# Patient Record
Sex: Female | Born: 1970 | Race: Black or African American | Hispanic: No | Marital: Married | Smoking: Never smoker
Health system: Southern US, Community
[De-identification: ages and names within clinical notes are randomized; demographics above are authoritative.]

## PROBLEM LIST (undated history)

## (undated) DIAGNOSIS — D649 Anemia, unspecified: Secondary | ICD-10-CM

## (undated) DIAGNOSIS — I10 Essential (primary) hypertension: Secondary | ICD-10-CM

## (undated) DIAGNOSIS — D563 Thalassemia minor: Secondary | ICD-10-CM

## (undated) HISTORY — DX: Thalassemia minor: D56.3

## (undated) HISTORY — DX: Anemia, unspecified: D64.9

## (undated) HISTORY — PX: OTHER SURGICAL HISTORY: SHX169

## (undated) HISTORY — DX: Essential (primary) hypertension: I10

---

## 1999-07-24 ENCOUNTER — Other Ambulatory Visit: Admission: RE | Admit: 1999-07-24 | Discharge: 1999-07-24 | Payer: Self-pay | Admitting: Internal Medicine

## 2001-04-25 ENCOUNTER — Other Ambulatory Visit: Admission: RE | Admit: 2001-04-25 | Discharge: 2001-04-25 | Payer: Self-pay | Admitting: Internal Medicine

## 2002-11-20 ENCOUNTER — Other Ambulatory Visit: Admission: RE | Admit: 2002-11-20 | Discharge: 2002-11-20 | Payer: Self-pay | Admitting: Surgery

## 2004-01-29 ENCOUNTER — Other Ambulatory Visit: Admission: RE | Admit: 2004-01-29 | Discharge: 2004-01-29 | Payer: Self-pay | Admitting: Internal Medicine

## 2005-11-24 ENCOUNTER — Inpatient Hospital Stay (HOSPITAL_COMMUNITY): Admission: AD | Admit: 2005-11-24 | Discharge: 2005-11-24 | Payer: Self-pay | Admitting: *Deleted

## 2005-11-24 ENCOUNTER — Inpatient Hospital Stay (HOSPITAL_COMMUNITY): Admission: AD | Admit: 2005-11-24 | Discharge: 2005-11-28 | Payer: Self-pay | Admitting: Obstetrics and Gynecology

## 2005-11-29 ENCOUNTER — Encounter: Admission: RE | Admit: 2005-11-29 | Discharge: 2005-12-29 | Payer: Self-pay | Admitting: Obstetrics and Gynecology

## 2005-12-06 ENCOUNTER — Inpatient Hospital Stay (HOSPITAL_COMMUNITY): Admission: AD | Admit: 2005-12-06 | Discharge: 2005-12-06 | Payer: Self-pay | Admitting: Obstetrics and Gynecology

## 2005-12-07 ENCOUNTER — Ambulatory Visit: Admission: RE | Admit: 2005-12-07 | Discharge: 2005-12-07 | Payer: Self-pay | Admitting: Obstetrics and Gynecology

## 2005-12-08 ENCOUNTER — Inpatient Hospital Stay (HOSPITAL_COMMUNITY): Admission: AD | Admit: 2005-12-08 | Discharge: 2005-12-08 | Payer: Self-pay | Admitting: Obstetrics and Gynecology

## 2005-12-30 ENCOUNTER — Encounter: Admission: RE | Admit: 2005-12-30 | Discharge: 2006-01-28 | Payer: Self-pay | Admitting: Obstetrics and Gynecology

## 2006-01-29 ENCOUNTER — Encounter: Admission: RE | Admit: 2006-01-29 | Discharge: 2006-02-28 | Payer: Self-pay | Admitting: Obstetrics and Gynecology

## 2006-03-01 ENCOUNTER — Encounter: Admission: RE | Admit: 2006-03-01 | Discharge: 2006-03-30 | Payer: Self-pay | Admitting: Obstetrics and Gynecology

## 2006-03-31 ENCOUNTER — Encounter: Admission: RE | Admit: 2006-03-31 | Discharge: 2006-04-30 | Payer: Self-pay | Admitting: Obstetrics and Gynecology

## 2006-05-01 ENCOUNTER — Encounter: Admission: RE | Admit: 2006-05-01 | Discharge: 2006-05-31 | Payer: Self-pay | Admitting: Obstetrics and Gynecology

## 2009-06-13 ENCOUNTER — Encounter (INDEPENDENT_AMBULATORY_CARE_PROVIDER_SITE_OTHER): Payer: Self-pay | Admitting: Obstetrics & Gynecology

## 2009-06-13 ENCOUNTER — Ambulatory Visit (HOSPITAL_COMMUNITY): Admission: AD | Admit: 2009-06-13 | Discharge: 2009-06-13 | Payer: Self-pay | Admitting: Internal Medicine

## 2009-06-28 ENCOUNTER — Inpatient Hospital Stay (HOSPITAL_COMMUNITY): Admission: AD | Admit: 2009-06-28 | Discharge: 2009-06-28 | Payer: Self-pay | Admitting: Obstetrics & Gynecology

## 2010-12-10 LAB — CBC
HCT: 32.3 % — ABNORMAL LOW (ref 36.0–46.0)
Hemoglobin: 10.5 g/dL — ABNORMAL LOW (ref 12.0–15.0)
Hemoglobin: 10.1 g/dL — ABNORMAL LOW (ref 12.0–15.0)
MCHC: 32 g/dL (ref 30.0–36.0)
MCV: 77.7 fL — ABNORMAL LOW (ref 78.0–100.0)
MCV: 77.8 fL — ABNORMAL LOW (ref 78.0–100.0)
RDW: 15 % (ref 11.5–15.5)
RDW: 15.3 % (ref 11.5–15.5)
WBC: 9.7 10*3/uL (ref 4.0–10.5)

## 2010-12-10 LAB — CREATININE, SERUM: GFR calc Af Amer: >60 mL/min (ref >60)

## 2010-12-10 LAB — DIFFERENTIAL
Basophils Absolute: 0 10*3/uL (ref 0.0–0.1)
Eosinophils Absolute: 0 10*3/uL (ref 0.0–0.7)
Lymphs Abs: 1.2 10*3/uL (ref 0.7–4.0)
Neutrophils Relative %: 83 % — ABNORMAL HIGH (ref 43–77)

## 2010-12-10 LAB — TYPE AND SCREEN
Antibody Screen: POSITIVE
DAT, IgG: NEGATIVE

## 2010-12-10 LAB — AST: AST: 18 U/L (ref 0–37)

## 2010-12-10 LAB — HCG, QUANTITATIVE, PREGNANCY: hCG, Beta Chain, Quant, S: 730 m[IU]/mL — ABNORMAL HIGH (ref <5)

## 2011-01-22 NOTE — Op Note (Signed)
[name], Alyssa Dunn               ACCOUNT NO.:  [id number]   MEDICAL RECORD NO.:  [id number]          PATIENT TYPE:  INP   LOCATION:  9105                          FACILITY:  [facility]   PHYSICIAN:  Lenoard Aden, M.D.DATE OF BIRTH:  June 14, 1971   DATE OF PROCEDURE:  DATE OF DISCHARGE:  11/28/2005                                 OPERATIVE REPORT   CHIEF COMPLAINT:  Thirty-four week intrauterine pregnancy, breech  presentation in active labor with spontaneous rupture of membranes and  complete dilatation.   POSTOPERATIVE DIAGNOSES:  1.  Thirty-four week intrauterine pregnancy, breech presentation in active      labor with spontaneous rupture of membranes and complete dilatation.  2.  Septate uterus.  Pregnancy in the right section of uterus.   OPERATION/PROCEDURE:  Primary emergent low segment transverse cesarean  section.   SURGEON:  Lenoard Aden, M.D.   ANESTHESIA:  Spinal.   ESTIMATED BLOOD LOSS:  1000 mL.   COMPLICATIONS:  None.   DRAINS:  Foley catheter.   COUNTS:  Correct.   CONDITION:  The patient to recovery in good condition.   DESCRIPTION OF PROCEDURE:  After being apprised of the risks of anesthesia,  infection, bleeding, injury to abdominal organs and need for repair, delayed  versus immediate complications to include bowel and bladder injury, the  patient is brought to the operating room where she is administered a spinal  anesthetic without complications, prepped and draped in the usual sterile  fashion.  Foley catheter placed.  After achieving adequate anesthesia, a  Pfannenstiel skin incision made, carried down to the fascia which was nicked  in the midline and opened transversely using Mayo scissors.  Rectus muscles  were dissected sharply in the midline.  Peritoneum entered sharply and  bladder blade placed.  Visceral peritoneum was scored in a smile-like  fashion.  Kerr hysterotomy incision made.  Atraumatic delivery of a footling  breech  presentation, is handed to the pediatricians in attendance.  Apgars 8  and 9, cord blood collected.  Placenta delivered manually intact.  Three-  vessel cord noted and sent to labor and delivery.  Uterus is exteriorized.  There is a soft, small subserosal fibroid in the midline of the fundus.  Normal tubes and normal ovaries.  Upon palpating the interior of the uterus,  there is an enlarged right cavity consistent with where a    pregnancy was  present and a thick midline septum with a separate left cavity noted.  Uterus was curetted using a dry lap pack and uterus is closed in two layers  using a 0 Monocryl suture in continuous running fashion.  Second imbricating  layer placed.  Bladder flap inspected and found to be hemostatic.  Irrigation is accomplished. There is a midline bleeder which is secured  using interrupted suture and a small area of bleeding along the bladder flap  which  is secured using an interrupted 3-0 Vicryl suture on an SH needle.  Irrigation is further accomplished.  Perineum is inspected and found to be  hemostatic.  Fascia then closed using 0 Monocryl in continuous running  fashion.  Skin closed using staples. The patient tolerates is transferred to  the recovery room in good condition.      Lenoard Aden, M.D.  Electronically Signed     RJT/MEDQ  D:  11/24/2005  T:  11/25/2005  Job:  [id number]

## 2011-01-22 NOTE — Discharge Summary (Signed)
NAMEKERBY, HOCKLEY               ACCOUNT NO.:  [id number]   MEDICAL RECORD NO.:  [id number]           PATIENT TYPE:   LOCATION:                                 FACILITY:   PHYSICIAN:  Lenoard Aden, M.D.     DATE OF BIRTH:   DATE OF ADMISSION:  11/24/2005  DATE OF DISCHARGE:  11/27/2005                                 DISCHARGE SUMMARY   Patient underwent uncomplicated emergent cesarean section for breech in  active labor, fully dilated.  Postoperative course uncomplicated.  Tolerated  diet well.  Discharged to home day three.  Discharge teaching done.  Follow  up in the office in four to six weeks.      Lenoard Aden, M.D.  Electronically Signed     RJT/MEDQ  D:  12/29/2005  T:  12/30/2005  Job:  [id number]

## 2011-03-22 ENCOUNTER — Ambulatory Visit
Admission: RE | Admit: 2011-03-22 | Discharge: 2011-03-22 | Disposition: A | Discharge status: Home / Self Care | Payer: BC Managed Care – PPO | Source: Ambulatory Visit | Attending: Family Medicine | Admitting: Family Medicine

## 2011-03-22 ENCOUNTER — Other Ambulatory Visit: Payer: Self-pay | Admitting: Family Medicine

## 2011-03-22 DIAGNOSIS — M545 Low back pain: Secondary | ICD-10-CM

## 2012-06-16 ENCOUNTER — Other Ambulatory Visit: Payer: Self-pay | Admitting: Family Medicine

## 2012-06-16 ENCOUNTER — Other Ambulatory Visit (HOSPITAL_COMMUNITY)
Admission: RE | Admit: 2012-06-16 | Discharge: 2012-06-16 | Disposition: A | Discharge status: Home / Self Care | Payer: BC Managed Care – PPO | Source: Ambulatory Visit | Attending: Family Medicine | Admitting: Family Medicine

## 2012-06-16 DIAGNOSIS — Z124 Encounter for screening for malignant neoplasm of cervix: Secondary | ICD-10-CM | POA: Insufficient documentation

## 2012-10-03 ENCOUNTER — Other Ambulatory Visit: Payer: Self-pay | Admitting: Family Medicine

## 2012-10-03 DIAGNOSIS — R102 Pelvic and perineal pain: Secondary | ICD-10-CM

## 2012-10-03 DIAGNOSIS — R1032 Left lower quadrant pain: Secondary | ICD-10-CM

## 2012-10-06 ENCOUNTER — Ambulatory Visit
Admission: RE | Admit: 2012-10-06 | Discharge: 2012-10-06 | Disposition: A | Discharge status: Home / Self Care | Payer: BC Managed Care – PPO | Source: Ambulatory Visit | Attending: Family Medicine | Admitting: Family Medicine

## 2012-10-06 DIAGNOSIS — R1032 Left lower quadrant pain: Secondary | ICD-10-CM

## 2012-10-06 DIAGNOSIS — R102 Pelvic and perineal pain: Secondary | ICD-10-CM

## 2013-11-05 ENCOUNTER — Other Ambulatory Visit: Payer: Self-pay | Admitting: Family Medicine

## 2013-11-05 DIAGNOSIS — Z1231 Encounter for screening mammogram for malignant neoplasm of breast: Secondary | ICD-10-CM

## 2013-11-20 ENCOUNTER — Ambulatory Visit
Admission: RE | Admit: 2013-11-20 | Discharge: 2013-11-20 | Disposition: A | Discharge status: Home / Self Care | Payer: BC Managed Care – PPO | Source: Ambulatory Visit | Attending: Family Medicine | Admitting: Family Medicine

## 2013-11-20 DIAGNOSIS — Z1231 Encounter for screening mammogram for malignant neoplasm of breast: Secondary | ICD-10-CM

## 2013-11-22 ENCOUNTER — Other Ambulatory Visit: Payer: Self-pay | Admitting: Family Medicine

## 2013-11-22 DIAGNOSIS — R928 Other abnormal and inconclusive findings on diagnostic imaging of breast: Secondary | ICD-10-CM

## 2013-12-05 ENCOUNTER — Ambulatory Visit
Admission: RE | Admit: 2013-12-05 | Discharge: 2013-12-05 | Disposition: A | Discharge status: Home / Self Care | Payer: BC Managed Care – PPO | Source: Ambulatory Visit | Attending: Family Medicine | Admitting: Family Medicine

## 2013-12-05 DIAGNOSIS — R928 Other abnormal and inconclusive findings on diagnostic imaging of breast: Secondary | ICD-10-CM

## 2015-10-22 IMAGING — MG MM DIGITAL DIAGNOSTIC UNILAT*L*
5 series · 5 of 5 positions shown · non-contrast
Comparison: Baseline screening mammogram 11/20/2013

CLINICAL DATA: Further evaluation of possible asymmetry posterior
medial left breast and calcifications upper-outer quadrant left
breast

EXAM:
DIGITAL DIAGNOSTIC  LEFT MAMMOGRAM

[L ML (1 of 2)]
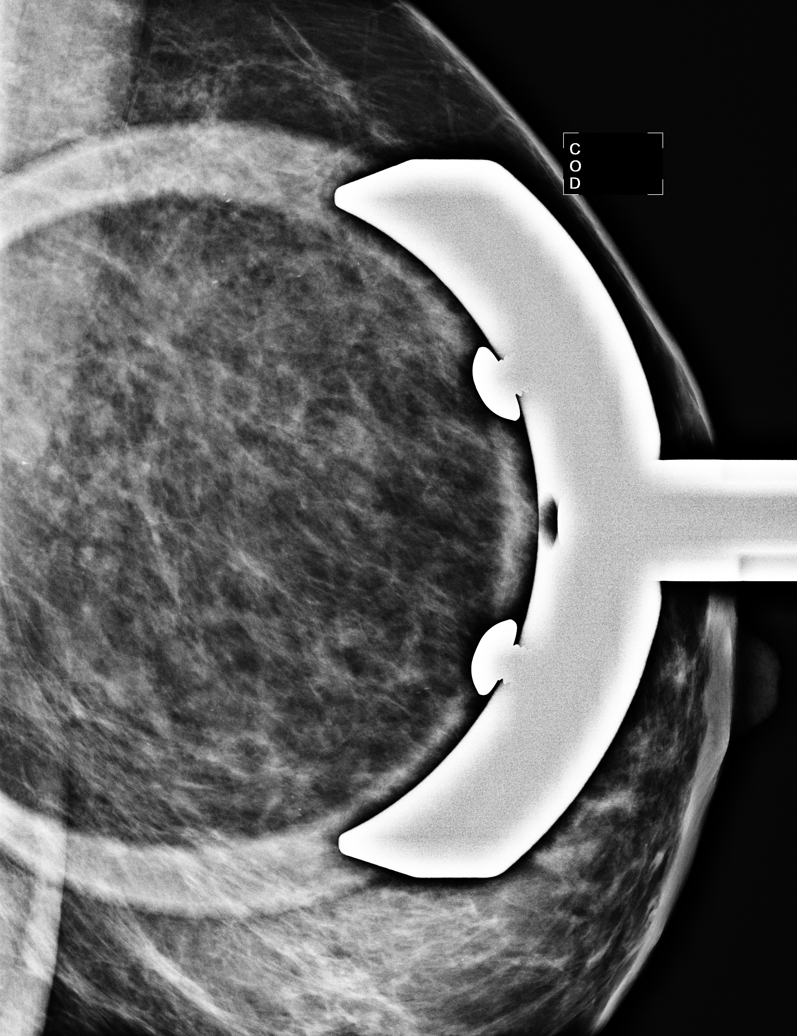

[L ML (2 of 2)]
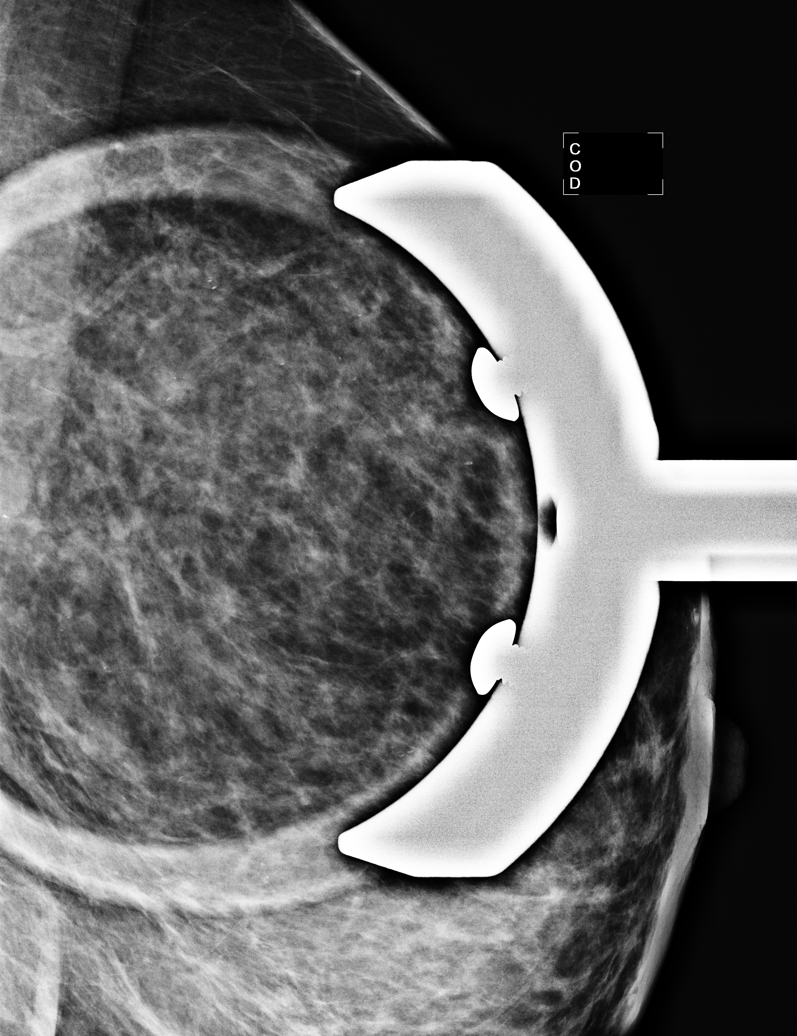

[L MLO]
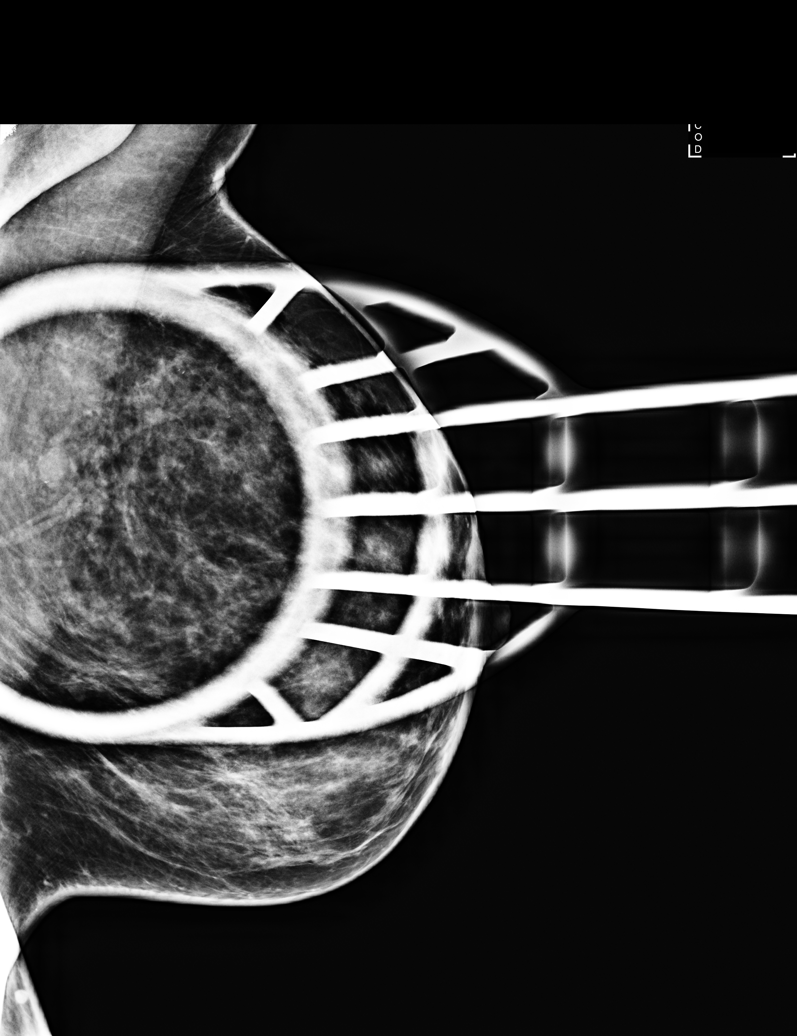

[L CC (1 of 2)]
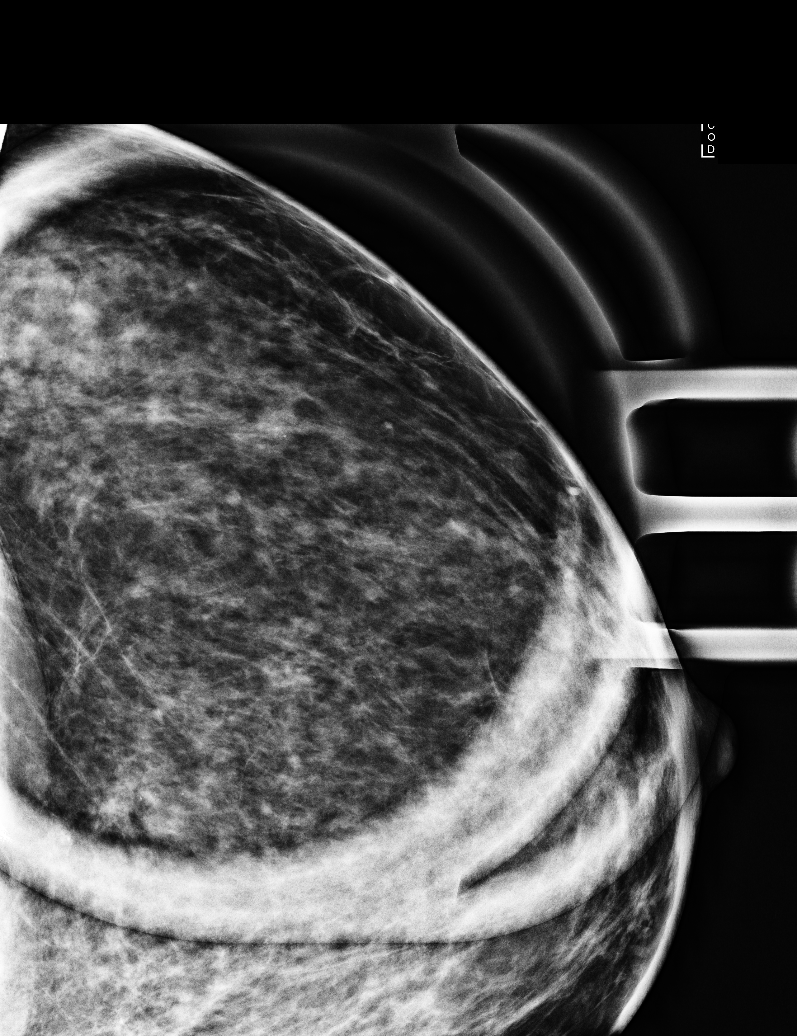

[L CC (2 of 2)]
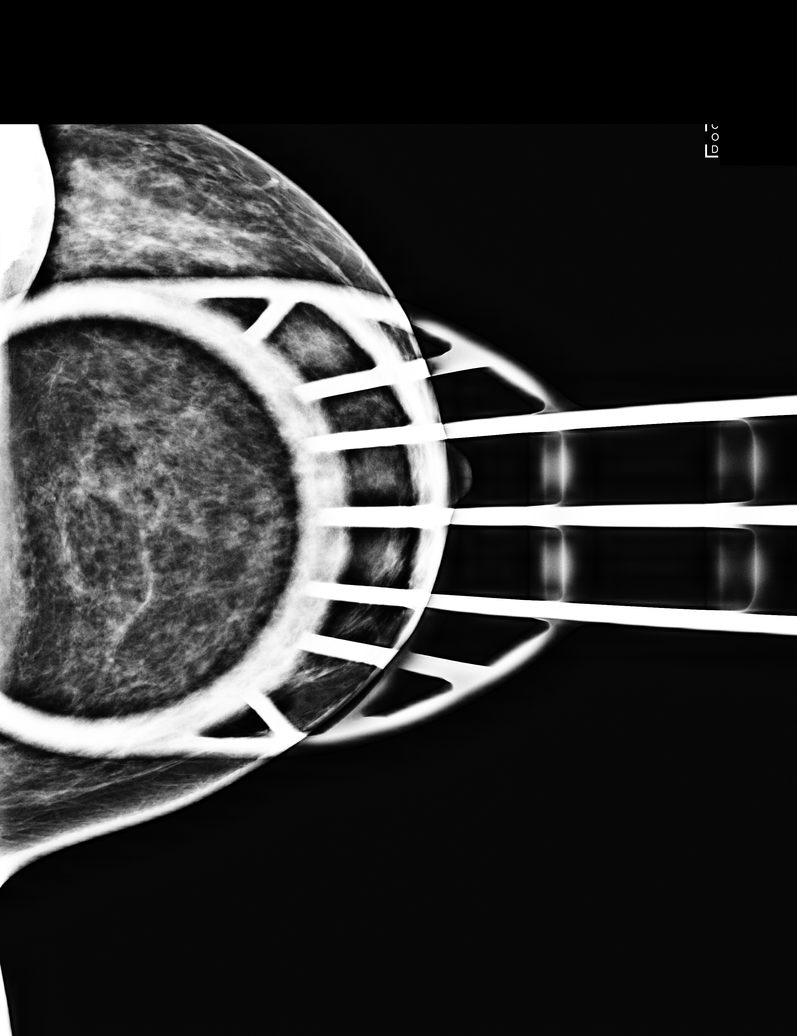

[5 of 5 positions shown; findings below may reference images not displayed]

ACR Breast Density Category c: The breast tissue is heterogeneously
dense, which may obscure small masses.
FINDINGS: Calcifications are characteristic milk of calcium, benign and
regionally distributed throughout the left upper outer quadrant on
magnified views. There is no persistent asymmetry on spot
compression views.
IMPRESSION: No suspicious findings.  Benign calcifications.

RECOMMENDATION:
Screening bilateral mammogram in 1 year

I have discussed the findings and recommendations with the patient.
Results were also provided in writing at the conclusion of the
visit. If applicable, a reminder letter will be sent to the patient
regarding the next appointment.

BI-RADS CATEGORY  2: Benign Finding(s)

## 2019-10-11 ENCOUNTER — Encounter: Payer: Self-pay | Admitting: Family Medicine

## 2019-10-11 ENCOUNTER — Other Ambulatory Visit: Payer: Self-pay

## 2019-10-11 ENCOUNTER — Ambulatory Visit: Payer: Managed Care, Other (non HMO) | Attending: Family Medicine | Admitting: Family Medicine

## 2019-10-11 VITALS — BP 164/100 | HR 102 | Temp 98.4°F | Resp 16 | Ht 66.5 in | Wt 189.0 lb

## 2019-10-11 DIAGNOSIS — H538 Other visual disturbances: Secondary | ICD-10-CM

## 2019-10-11 DIAGNOSIS — R5383 Other fatigue: Secondary | ICD-10-CM | POA: Diagnosis not present

## 2019-10-11 DIAGNOSIS — D649 Anemia, unspecified: Secondary | ICD-10-CM

## 2019-10-11 DIAGNOSIS — R030 Elevated blood-pressure reading, without diagnosis of hypertension: Secondary | ICD-10-CM | POA: Diagnosis not present

## 2019-10-11 DIAGNOSIS — Z13220 Encounter for screening for lipoid disorders: Secondary | ICD-10-CM

## 2019-10-11 LAB — GLUCOSE, POCT (MANUAL RESULT ENTRY): POC Glucose: 68 mg/dL — AB (ref 70–99)

## 2019-10-11 LAB — POCT GLYCOSYLATED HEMOGLOBIN (HGB A1C): Hemoglobin A1C: 5.4 % (ref 4.0–5.6)

## 2019-10-11 NOTE — Progress Notes (Signed)
Subjective:  Patient ID: Alyssa Dunn, female    DOB: Jan 08, 1971  Age: 49 y.o. MRN: [id number]  CC: New Patient (Initial Visit)   HPI Alyssa Dunn, 49 year old African-American female, new to the practice, who presents to establish care due to concern of recurrent issues with blurred vision.  She reports that over the past year she has noticed recurrent episodes of blurred vision and sometimes feels as if there is almost a film over her vision.  She denies sensation of darkening of vision as if a blind is being pulled down in front of her eyes.  She has occasional mild dizziness with changes in position but no issues with recurrent headaches.  No episodes of focal numbness or weakness.  She does feel as if she has had a mild increase in fatigue.  She notes that because of the current pandemic she is not as active.  She is currently Biomedical engineer.  Patient lived in the [place] area but moved back to [place] in 2014 and reports that when living in [place] she is much more active as she walks most places that she needed to go but since returning recently to [place], her lifestyle has been more sedentary.  She feels that she has gained weight.        She denies any issues with increased thirst or urinary frequency.  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  She denies any issues with chest pain or palpitations, no shortness of breath or cough.  No abdominal pain-no nausea/vomiting/diarrhea or constipation.  She has had no blood in the stool or black stools.  She has not had any issues with unexplained muscle or joint pain and no peripheral edema.  She has had no prior diagnoses of gestational diabetes, no prior hypertension or preeclampsia.  She has noticed some areas of increased pigmentation on her lower legs.  She is not currently on any prescription medications.  She occasionally takes an over-the-counter iron  supplement.  Past Medical History:  Diagnosis Date  . Anemia   . Thalassemia trait     Past Surgical History:  Procedure Laterality Date  . CESAREAN SECTION  2014  . laparoscopy      Family History  Problem Relation Age of Onset  . Renal Disease Paternal Grandmother   . Stroke Paternal Grandfather   . Diabetes Neg Hx   . Heart disease Neg Hx   . Cancer Neg Hx     Social History   Tobacco Use  . Smoking status: Never Smoker  . Smokeless tobacco: Never Used  Substance Use Topics  . Alcohol use: Yes    Alcohol/week: 2.0 standard drinks    Types: 2 Glasses of wine per week    ROS Review of Systems  Constitutional: Positive for fatigue. Negative for chills and fever.       Weight gain which she believes is due to age/decreased level of activity with pandemic  HENT: Negative for sore throat and trouble swallowing.   Eyes: Positive for visual disturbance. Negative for photophobia.  Respiratory: Negative for cough and shortness of breath.   Cardiovascular: Negative for chest pain and palpitations.  Gastrointestinal: Negative for abdominal pain, blood in stool, constipation, diarrhea and nausea.  Endocrine: Negative for polydipsia, polyphagia and polyuria.  Genitourinary: Negative for dysuria and frequency.  Musculoskeletal: Negative for arthralgias and back pain.  Neurological: Positive for headaches. Negative for dizziness.  Hematological: Negative for adenopathy. Does not bruise/bleed easily.    Objective:   Today's Vitals: BP (!) 164/100 (BP Location: Left Arm, Cuff Size: Large)   Pulse (!) 102   Temp 98.4 F (36.9 C)   Resp 16   Ht 5' 6.5" (1.689 m)   Wt 189 lb (85.7 kg)   LMP 09/14/2019 (Approximate)   SpO2 100%   BMI 30.05 kg/m   Physical Exam Constitutional:      General: She is not in acute distress.    Appearance: Normal appearance. She is not ill-appearing.  Neck:     Vascular: No carotid bruit.  Cardiovascular:     Rate and Rhythm: Normal rate  and regular rhythm.  Pulmonary:     Effort: Pulmonary effort is normal.     Breath sounds: Normal breath sounds.  Abdominal:     Palpations: Abdomen is soft.     Tenderness: There is no abdominal tenderness. There is no right CVA tenderness, left CVA tenderness, guarding or rebound.  Musculoskeletal:        General: No tenderness or deformity.     Cervical back: Normal range of motion and neck supple. No tenderness.     Right lower leg: No edema.     Left lower leg: No edema.  Lymphadenopathy:     Cervical: No cervical adenopathy.  Skin:    General: Skin is warm and dry.  Neurological:     General: No focal deficit present.     Mental Status: She is alert and oriented to person, place, and time.     Comments: No nystagmus but mild lag in eye movements with side to side gaze  Psychiatric:        Mood and Affect: Mood normal.        Behavior: Behavior normal.     Assessment & Plan:  1. Blurred vision Patient with complaint of blurred vision.  Glucose and hemoglobin A1c done at today's visit to rule out hypoglycemia/diabetes as a cause of her blurred vision.  She will additionally have BMP to look for elevated glucose or electrolyte abnormality.  On examination, she does have some mild lack and eye movements which may or may not be a contributing factor in her blurred vision.  She will be referred to ophthalmology for further evaluation and treatment.  Patient had blood sugar 68 without any hypoglycemic symptoms and hemoglobin A1c was normal at 5.4. - Glucose (CBG) - HgB A1c - Basic Metabolic Panel - Ambulatory referral to Ophthalmology  2. Anemia, unspecified type Patient with history of anemia as well as alpha thalassemia trait.  She will have repeat CBC at today's visit and will be notified if further intervention such as iron supplements are needed to help with anemia.  Continue diet of foods rich in iron. - CBC  3. Elevated blood-pressure reading without diagnosis of  hypertension Blood pressure on repeat with manual reading of 164/100.  She would like to try dietary changes and weight loss prior to starting any medication to help lower her blood pressure.  Educational material on [employer] as well as on preventing hypertension were given as part of AVS.  Discussed diagnosis and treatment with the patient at today's visit.  4. Fatigue, unspecified type Patient has had some mild fatigue which she attributes to her history of anemia as well as weight gain as she has been less active during the current Covid pandemic.  She will have blood work and follow-up of fatigue including CBC  to assess anemia, basic metabolic panel to look for electrolyte abnormality or elevated blood sugar as well as TSH to look for thyroid disorder such as an underactive thyroid which may be contributing to her fatigue. - CBC - Basic Metabolic Panel - TSH   An After Visit Summary was printed and given to the patient.   Follow-up: Return in about 3 weeks (around 11/01/2019) for Blood pressure/lab f/u.    Antony Blackbird MD

## 2019-10-11 NOTE — Patient Instructions (Addendum)
Hypertension, Adult Hypertension is another name for high blood pressure. High blood pressure forces your heart to work harder to pump blood. This can cause problems over time. There are two numbers in a blood pressure reading. There is a top number (systolic) over a bottom number (diastolic). It is best to have a blood pressure that is below 120/80. Healthy choices can help lower your blood pressure, or you may need medicine to help lower it. What are the causes? The cause of this condition is not known. Some conditions may be related to high blood pressure. What increases the risk?  Smoking.  Having type 2 diabetes mellitus, high cholesterol, or both.  Not getting enough exercise or physical activity.  Being overweight.  Having too much fat, sugar, calories, or salt (sodium) in your diet.  Drinking too much alcohol.  Having long-term (chronic) kidney disease.  Having a family history of high blood pressure.  Age. Risk increases with age.  Race. You may be at higher risk if you are African American.  Gender. Men are at higher risk than women before age 45. After age 65, women are at higher risk than men.  Having obstructive sleep apnea.  Stress. What are the signs or symptoms?  High blood pressure may not cause symptoms. Very high blood pressure (hypertensive crisis) may cause: ? Headache. ? Feelings of worry or nervousness (anxiety). ? Shortness of breath. ? Nosebleed. ? A feeling of being sick to your stomach (nausea). ? Throwing up (vomiting). ? Changes in how you see. ? Very bad chest pain. ? Seizures. How is this treated?  This condition is treated by making healthy lifestyle changes, such as: ? Eating healthy foods. ? Exercising more. ? Drinking less alcohol.  Your health care provider may prescribe medicine if lifestyle changes are not enough to get your blood pressure under control, and if: ? Your top number is above 130. ? Your bottom number is above  80.  Your personal target blood pressure may vary. Follow these instructions at home: Eating and drinking   If told, follow the DASH eating plan. To follow this plan: ? Fill one half of your plate at each meal with fruits and vegetables. ? Fill one fourth of your plate at each meal with whole grains. Whole grains include whole-wheat pasta, brown rice, and whole-grain bread. ? Eat or drink low-fat dairy products, such as skim milk or low-fat yogurt. ? Fill one fourth of your plate at each meal with low-fat (lean) proteins. Low-fat proteins include fish, chicken without skin, eggs, beans, and tofu. ? Avoid fatty meat, cured and processed meat, or chicken with skin. ? Avoid pre-made or processed food.  Eat less than 1,500 mg of salt each day.  Do not drink alcohol if: ? Your doctor tells you not to drink. ? You are pregnant, may be pregnant, or are planning to become pregnant.  If you drink alcohol: ? Limit how much you use to:  0-1 drink a day for women.  0-2 drinks a day for men. ? Be aware of how much alcohol is in your drink. In the [place] one drink equals one 12 oz bottle of beer (355 mL), one 5 oz glass of wine (148 mL), or one 1 oz glass of hard liquor (44 mL). Lifestyle   Work with your doctor to stay at a healthy weight or to lose weight. Ask your doctor what the best weight is for you.  Get at least 30 minutes of exercise most   days of the week. This may include walking, swimming, or biking.  Get at least 30 minutes of exercise that strengthens your muscles (resistance exercise) at least 3 days a week. This may include lifting weights or doing Pilates.  Do not use any products that contain nicotine or tobacco, such as cigarettes, e-cigarettes, and chewing tobacco. If you need help quitting, ask your doctor.  Check your blood pressure at home as told by your doctor.  Keep all follow-up visits as told by your doctor. This is important. Medicines  Take over-the-counter  and prescription medicines only as told by your doctor. Follow directions carefully.  Do not skip doses of blood pressure medicine. The medicine does not work as well if you skip doses. Skipping doses also puts you at risk for problems.  Ask your doctor about side effects or reactions to medicines that you should watch for. Contact a doctor if you:  Think you are having a reaction to the medicine you are taking.  Have headaches that keep coming back (recurring).  Feel dizzy.  Have swelling in your ankles.  Have trouble with your vision. Get help right away if you:  Get a very bad headache.  Start to feel mixed up (confused).  Feel weak or numb.  Feel faint.  Have very bad pain in your: ? Chest. ? Belly (abdomen).  Throw up more than once.  Have trouble breathing. Summary  Hypertension is another name for high blood pressure.  High blood pressure forces your heart to work harder to pump blood.  For most people, a normal blood pressure is less than 120/80.  Making healthy choices can help lower blood pressure. If your blood pressure does not get lower with healthy choices, you may need to take medicine. This information is not intended to replace advice given to you by your health care provider. Make sure you discuss any questions you have with your health care provider. Document Revised: 05/03/2018 Document Reviewed: 05/03/2018 Elsevier Patient Education  2020 [employer] DASH Eating Plan DASH stands for "Dietary Approaches to Stop Hypertension." The DASH eating plan is a healthy eating plan that has been shown to reduce high blood pressure (hypertension). It may also reduce your risk for type 2 diabetes, heart disease, and stroke. The DASH eating plan may also help with weight loss. What are tips for following this plan?  General guidelines  Avoid eating more than 2,300 mg (milligrams) of salt (sodium) a day. If you have hypertension, you may need to reduce your  sodium intake to 1,500 mg a day.  Limit alcohol intake to no more than 1 drink a day for nonpregnant women and 2 drinks a day for men. One drink equals 12 oz of beer, 5 oz of wine, or 1 oz of hard liquor.  Work with your health care provider to maintain a healthy body weight or to lose weight. Ask what an ideal weight is for you.  Get at least 30 minutes of exercise that causes your heart to beat faster (aerobic exercise) most days of the week. Activities may include walking, swimming, or biking.  Work with your health care provider or diet and nutrition specialist (dietitian) to adjust your eating plan to your individual calorie needs. Reading food labels   Check food labels for the amount of sodium per serving. Choose foods with less than 5 percent of the Daily Value of sodium. Generally, foods with less than 300 mg of sodium per serving fit into this eating plan.    To find whole grains, look for the word "whole" as the first word in the ingredient list. Shopping  Buy products labeled as "low-sodium" or "no salt added."  Buy fresh foods. Avoid canned foods and premade or frozen meals. Cooking  Avoid adding salt when cooking. Use salt-free seasonings or herbs instead of table salt or sea salt. Check with your health care provider or pharmacist before using salt substitutes.  Do not fry foods. Cook foods using healthy methods such as baking, boiling, grilling, and broiling instead.  Cook with heart-healthy oils, such as olive, canola, soybean, or sunflower oil. Meal planning  Eat a balanced diet that includes: ? 5 or more servings of fruits and vegetables each day. At each meal, try to fill half of your plate with fruits and vegetables. ? Up to 6-8 servings of whole grains each day. ? Less than 6 oz of lean meat, poultry, or fish each day. A 3-oz serving of meat is about the same size as a deck of cards. One egg equals 1 oz. ? 2 servings of low-fat dairy each day. ? A serving of  nuts, seeds, or beans 5 times each week. ? Heart-healthy fats. Healthy fats called Omega-3 fatty acids are found in foods such as flaxseeds and coldwater fish, like sardines, salmon, and mackerel.  Limit how much you eat of the following: ? Canned or prepackaged foods. ? Food that is high in trans fat, such as fried foods. ? Food that is high in saturated fat, such as fatty meat. ? Sweets, desserts, sugary drinks, and other foods with added sugar. ? Full-fat dairy products.  Do not salt foods before eating.  Try to eat at least 2 vegetarian meals each week.  Eat more home-cooked food and less restaurant, buffet, and fast food.  When eating at a restaurant, ask that your food be prepared with less salt or no salt, if possible. What foods are recommended? The items listed may not be a complete list. Talk with your dietitian about what dietary choices are best for you. Grains Whole-grain or whole-wheat bread. Whole-grain or whole-wheat pasta. Brown rice. Oatmeal. Quinoa. Bulgur. Whole-grain and low-sodium cereals. Pita bread. Low-fat, low-sodium crackers. Whole-wheat flour tortillas. Vegetables Fresh or frozen vegetables (raw, steamed, roasted, or grilled). Low-sodium or reduced-sodium tomato and vegetable juice. Low-sodium or reduced-sodium tomato sauce and tomato paste. Low-sodium or reduced-sodium canned vegetables. Fruits All fresh, dried, or frozen fruit. Canned fruit in natural juice (without added sugar). Meat and other protein foods Skinless chicken or turkey. Ground chicken or turkey. Pork with fat trimmed off. Fish and seafood. Egg whites. Dried beans, peas, or lentils. Unsalted nuts, nut butters, and seeds. Unsalted canned beans. Lean cuts of beef with fat trimmed off. Low-sodium, lean deli meat. Dairy Low-fat (1%) or fat-free (skim) milk. Fat-free, low-fat, or reduced-fat cheeses. Nonfat, low-sodium ricotta or cottage cheese. Low-fat or nonfat yogurt. Low-fat, low-sodium  cheese. Fats and oils Soft margarine without trans fats. Vegetable oil. Low-fat, reduced-fat, or light mayonnaise and salad dressings (reduced-sodium). Canola, safflower, olive, soybean, and sunflower oils. Avocado. Seasoning and other foods Herbs. Spices. Seasoning mixes without salt. Unsalted popcorn and pretzels. Fat-free sweets. What foods are not recommended? The items listed may not be a complete list. Talk with your dietitian about what dietary choices are best for you. Grains Baked goods made with fat, such as croissants, muffins, or some breads. Dry pasta or rice meal packs. Vegetables Creamed or fried vegetables. Vegetables in a cheese sauce. Regular canned vegetables (not   low-sodium or reduced-sodium). Regular canned tomato sauce and paste (not low-sodium or reduced-sodium). Regular tomato and vegetable juice (not low-sodium or reduced-sodium). Pickles. Olives. Fruits Canned fruit in a light or heavy syrup. Fried fruit. Fruit in cream or butter sauce. Meat and other protein foods Fatty cuts of meat. Ribs. Fried meat. Bacon. Sausage. Bologna and other processed lunch meats. Salami. Fatback. Hotdogs. Bratwurst. Salted nuts and seeds. Canned beans with added salt. Canned or smoked fish. Whole eggs or egg yolks. Chicken or turkey with skin. Dairy Whole or 2% milk, cream, and half-and-half. Whole or full-fat cream cheese. Whole-fat or sweetened yogurt. Full-fat cheese. Nondairy creamers. Whipped toppings. Processed cheese and cheese spreads. Fats and oils Butter. Stick margarine. Lard. Shortening. Ghee. Bacon fat. Tropical oils, such as coconut, palm kernel, or palm oil. Seasoning and other foods Salted popcorn and pretzels. Onion salt, garlic salt, seasoned salt, table salt, and sea salt. Worcestershire sauce. Tartar sauce. Barbecue sauce. Teriyaki sauce. Soy sauce, including reduced-sodium. Steak sauce. Canned and packaged gravies. Fish sauce. Oyster sauce. Cocktail sauce. Horseradish  that you find on the shelf. Ketchup. Mustard. Meat flavorings and tenderizers. Bouillon cubes. Hot sauce and Tabasco sauce. Premade or packaged marinades. Premade or packaged taco seasonings. Relishes. Regular salad dressings. Where to find more information:  National Heart, Lung, and Blood Institute: www.nhlbi.nih.gov  American Heart Association: www.heart.org Summary  The DASH eating plan is a healthy eating plan that has been shown to reduce high blood pressure (hypertension). It may also reduce your risk for type 2 diabetes, heart disease, and stroke.  With the DASH eating plan, you should limit salt (sodium) intake to 2,300 mg a day. If you have hypertension, you may need to reduce your sodium intake to 1,500 mg a day.  When on the DASH eating plan, aim to eat more fresh fruits and vegetables, whole grains, lean proteins, low-fat dairy, and heart-healthy fats.  Work with your health care provider or diet and nutrition specialist (dietitian) to adjust your eating plan to your individual calorie needs. This information is not intended to replace advice given to you by your health care provider. Make sure you discuss any questions you have with your health care provider. Document Revised: 08/05/2017 Document Reviewed: 08/16/2016 Elsevier Patient Education  2020 [employer]  

## 2019-10-11 NOTE — Progress Notes (Signed)
Blurriness in vision

## 2019-10-12 LAB — LIPID PANEL
Chol/HDL Ratio: 2.1 ratio (ref 0.0–4.4)
Cholesterol, Total: 128 mg/dL (ref 100–199)
HDL: 60 mg/dL
LDL Chol Calc (NIH): 58 mg/dL (ref 0–99)
Triglycerides: 40 mg/dL (ref 0–149)
VLDL Cholesterol Cal: 10 mg/dL (ref 5–40)

## 2019-10-12 LAB — CBC
Hematocrit: 36.7 % (ref 34.0–46.6)
Hemoglobin: 10.9 g/dL — ABNORMAL LOW (ref 11.1–15.9)
MCH: 21.9 pg — ABNORMAL LOW (ref 26.6–33.0)
MCHC: 29.7 g/dL — ABNORMAL LOW (ref 31.5–35.7)
MCV: 74 fL — ABNORMAL LOW (ref 79–97)
RBC: 4.98 x10E6/uL (ref 3.77–5.28)
RDW: 18.3 % — ABNORMAL HIGH (ref 11.7–15.4)
WBC: 6 x10E3/uL (ref 3.4–10.8)

## 2019-10-12 LAB — BASIC METABOLIC PANEL WITH GFR
BUN/Creatinine Ratio: 11 (ref 9–23)
BUN: 11 mg/dL (ref 6–24)
CO2: 19 mmol/L — ABNORMAL LOW (ref 20–29)
Calcium: 9.1 mg/dL (ref 8.7–10.2)
Chloride: 103 mmol/L (ref 96–106)
Creatinine, Ser: 1.01 mg/dL — ABNORMAL HIGH (ref 0.57–1.00)
GFR calc Af Amer: 77 mL/min/1.73
GFR calc non Af Amer: 67 mL/min/1.73
Glucose: 79 mg/dL (ref 65–99)
Potassium: 4.7 mmol/L (ref 3.5–5.2)
Sodium: 137 mmol/L (ref 134–144)

## 2019-10-12 LAB — TSH: TSH: 0.578 u[IU]/mL (ref 0.450–4.500)

## 2019-10-13 ENCOUNTER — Encounter: Payer: Self-pay | Admitting: Family Medicine

## 2019-10-18 ENCOUNTER — Encounter: Payer: Self-pay | Admitting: *Deleted

## 2019-10-18 DIAGNOSIS — Z862 Personal history of diseases of the blood and blood-forming organs and certain disorders involving the immune mechanism: Secondary | ICD-10-CM | POA: Insufficient documentation

## 2019-11-01 ENCOUNTER — Ambulatory Visit: Payer: Managed Care, Other (non HMO) | Admitting: Family Medicine

## 2019-11-15 ENCOUNTER — Other Ambulatory Visit: Payer: Self-pay

## 2019-11-15 ENCOUNTER — Ambulatory Visit: Payer: Managed Care, Other (non HMO) | Attending: Family Medicine | Admitting: Family Medicine

## 2019-11-15 ENCOUNTER — Encounter: Payer: Self-pay | Admitting: Family Medicine

## 2019-11-15 VITALS — BP 154/90 | HR 94 | Temp 98.4°F | Resp 20 | Wt 167.4 lb

## 2019-11-15 DIAGNOSIS — D509 Iron deficiency anemia, unspecified: Secondary | ICD-10-CM

## 2019-11-15 DIAGNOSIS — R030 Elevated blood-pressure reading, without diagnosis of hypertension: Secondary | ICD-10-CM | POA: Diagnosis not present

## 2019-11-15 NOTE — Patient Instructions (Signed)
DASH Eating Plan DASH stands for "Dietary Approaches to Stop Hypertension." The DASH eating plan is a healthy eating plan that has been shown to reduce high blood pressure (hypertension). It may also reduce your risk for type 2 diabetes, heart disease, and stroke. The DASH eating plan may also help with weight loss. What are tips for following this plan?  General guidelines  Avoid eating more than 2,300 mg (milligrams) of salt (sodium) a day. If you have hypertension, you may need to reduce your sodium intake to 1,500 mg a day.  Limit alcohol intake to no more than 1 drink a day for nonpregnant women and 2 drinks a day for men. One drink equals 12 oz of beer, 5 oz of wine, or 1 oz of hard liquor.  Work with your health care provider to maintain a healthy body weight or to lose weight. Ask what an ideal weight is for you.  Get at least 30 minutes of exercise that causes your heart to beat faster (aerobic exercise) most days of the week. Activities may include walking, swimming, or biking.  Work with your health care provider or diet and nutrition specialist (dietitian) to adjust your eating plan to your individual calorie needs. Reading food labels   Check food labels for the amount of sodium per serving. Choose foods with less than 5 percent of the Daily Value of sodium. Generally, foods with less than 300 mg of sodium per serving fit into this eating plan.  To find whole grains, look for the word "whole" as the first word in the ingredient list. Shopping  Buy products labeled as "low-sodium" or "no salt added."  Buy fresh foods. Avoid canned foods and premade or frozen meals. Cooking  Avoid adding salt when cooking. Use salt-free seasonings or herbs instead of table salt or sea salt. Check with your health care provider or pharmacist before using salt substitutes.  Do not fry foods. Cook foods using healthy methods such as baking, boiling, grilling, and broiling instead.  Cook with  heart-healthy oils, such as olive, canola, soybean, or sunflower oil. Meal planning  Eat a balanced diet that includes: ? 5 or more servings of fruits and vegetables each day. At each meal, try to fill half of your plate with fruits and vegetables. ? Up to 6-8 servings of whole grains each day. ? Less than 6 oz of lean meat, poultry, or fish each day. A 3-oz serving of meat is about the same size as a deck of cards. One egg equals 1 oz. ? 2 servings of low-fat dairy each day. ? A serving of nuts, seeds, or beans 5 times each week. ? Heart-healthy fats. Healthy fats called Omega-3 fatty acids are found in foods such as flaxseeds and coldwater fish, like sardines, salmon, and mackerel.  Limit how much you eat of the following: ? Canned or prepackaged foods. ? Food that is high in trans fat, such as fried foods. ? Food that is high in saturated fat, such as fatty meat. ? Sweets, desserts, sugary drinks, and other foods with added sugar. ? Full-fat dairy products.  Do not salt foods before eating.  Try to eat at least 2 vegetarian meals each week.  Eat more home-cooked food and less restaurant, buffet, and fast food.  When eating at a restaurant, ask that your food be prepared with less salt or no salt, if possible. What foods are recommended? The items listed may not be a complete list. Talk with your dietitian about   what dietary choices are best for you. Grains Whole-grain or whole-wheat bread. Whole-grain or whole-wheat pasta. Brown rice. Oatmeal. Quinoa. Bulgur. Whole-grain and low-sodium cereals. Pita bread. Low-fat, low-sodium crackers. Whole-wheat flour tortillas. Vegetables Fresh or frozen vegetables (raw, steamed, roasted, or grilled). Low-sodium or reduced-sodium tomato and vegetable juice. Low-sodium or reduced-sodium tomato sauce and tomato paste. Low-sodium or reduced-sodium canned vegetables. Fruits All fresh, dried, or frozen fruit. Canned fruit in natural juice (without  added sugar). Meat and other protein foods Skinless chicken or turkey. Ground chicken or turkey. Pork with fat trimmed off. Fish and seafood. Egg whites. Dried beans, peas, or lentils. Unsalted nuts, nut butters, and seeds. Unsalted canned beans. Lean cuts of beef with fat trimmed off. Low-sodium, lean deli meat. Dairy Low-fat (1%) or fat-free (skim) milk. Fat-free, low-fat, or reduced-fat cheeses. Nonfat, low-sodium ricotta or cottage cheese. Low-fat or nonfat yogurt. Low-fat, low-sodium cheese. Fats and oils Soft margarine without trans fats. Vegetable oil. Low-fat, reduced-fat, or light mayonnaise and salad dressings (reduced-sodium). Canola, safflower, olive, soybean, and sunflower oils. Avocado. Seasoning and other foods Herbs. Spices. Seasoning mixes without salt. Unsalted popcorn and pretzels. Fat-free sweets. What foods are not recommended? The items listed may not be a complete list. Talk with your dietitian about what dietary choices are best for you. Grains Baked goods made with fat, such as croissants, muffins, or some breads. Dry pasta or rice meal packs. Vegetables Creamed or fried vegetables. Vegetables in a cheese sauce. Regular canned vegetables (not low-sodium or reduced-sodium). Regular canned tomato sauce and paste (not low-sodium or reduced-sodium). Regular tomato and vegetable juice (not low-sodium or reduced-sodium). Pickles. Olives. Fruits Canned fruit in a light or heavy syrup. Fried fruit. Fruit in cream or butter sauce. Meat and other protein foods Fatty cuts of meat. Ribs. Fried meat. Bacon. Sausage. Bologna and other processed lunch meats. Salami. Fatback. Hotdogs. Bratwurst. Salted nuts and seeds. Canned beans with added salt. Canned or smoked fish. Whole eggs or egg yolks. Chicken or turkey with skin. Dairy Whole or 2% milk, cream, and half-and-half. Whole or full-fat cream cheese. Whole-fat or sweetened yogurt. Full-fat cheese. Nondairy creamers. Whipped toppings.  Processed cheese and cheese spreads. Fats and oils Butter. Stick margarine. Lard. Shortening. Ghee. Bacon fat. Tropical oils, such as coconut, palm kernel, or palm oil. Seasoning and other foods Salted popcorn and pretzels. Onion salt, garlic salt, seasoned salt, table salt, and sea salt. Worcestershire sauce. Tartar sauce. Barbecue sauce. Teriyaki sauce. Soy sauce, including reduced-sodium. Steak sauce. Canned and packaged gravies. Fish sauce. Oyster sauce. Cocktail sauce. Horseradish that you find on the shelf. Ketchup. Mustard. Meat flavorings and tenderizers. Bouillon cubes. Hot sauce and Tabasco sauce. Premade or packaged marinades. Premade or packaged taco seasonings. Relishes. Regular salad dressings. Where to find more information:  National Heart, Lung, and Blood Institute: www.nhlbi.nih.gov  American Heart Association: www.heart.org Summary  The DASH eating plan is a healthy eating plan that has been shown to reduce high blood pressure (hypertension). It may also reduce your risk for type 2 diabetes, heart disease, and stroke.  With the DASH eating plan, you should limit salt (sodium) intake to 2,300 mg a day. If you have hypertension, you may need to reduce your sodium intake to 1,500 mg a day.  When on the DASH eating plan, aim to eat more fresh fruits and vegetables, whole grains, lean proteins, low-fat dairy, and heart-healthy fats.  Work with your health care provider or diet and nutrition specialist (dietitian) to adjust your eating plan to your   individual calorie needs. This information is not intended to replace advice given to you by your health care provider. Make sure you discuss any questions you have with your health care provider. Document Revised: 08/05/2017 Document Reviewed: 08/16/2016 Elsevier Patient Education  2020 [employer]  

## 2019-11-15 NOTE — Progress Notes (Signed)
F/u HTN Takes BP readings at home.

## 2019-11-15 NOTE — Progress Notes (Signed)
Subjective:  Patient ID: Alyssa Dunn, female    DOB: 08-17-1971  Age: 49 y.o. MRN: [id number]  CC: No chief complaint on file. follow-up of hypertension, anemia-C. Marionna Gonia, MD  HPI Alyssa Dunn, 49 year old African-American female, who presents in follow-up of elevated blood pressure/hypertension and anemia.  Patient has had elevated blood pressure readings in the past but does not wish to be on blood pressure medicine and since her last visit, she reports that she has lost weight through dietary changes and exercise.  When she monitors her home blood pressures, blood pressures are usually in the 130s to 140s with the highest being 143 and somewhere between 80-90 for the bottom number.  She has had blood pressures as low as 120s over 70s to 80s.  She reports that she checks her blood pressure multiple times per day.  She does feel that when she comes to the doctor's office that it causes her to feel nervous and she will have increased heart rate as well as increased blood pressure.  She has noticed however that since making dietary changes and losing weight, she is no longer having any headaches or lightheadedness/dizziness.  She denies any current chest pain or palpitations, no shortness of breath or cough, no abdominal pain-no nausea or vomiting.  She has had no peripheral edema.  She feels that her fatigue has improved somewhat.  She did receive notification regarding her labs but did not wish to take an iron supplement but has started an over-the-counter multivitamin with iron.  Past Medical History:  Diagnosis Date  . Anemia   . Thalassemia trait     Past Surgical History:  Procedure Laterality Date  . CESAREAN SECTION  2014  . laparoscopy      Family History  Problem Relation Age of Onset  . Renal Disease Paternal Grandmother   . Stroke Paternal Grandfather   . Diabetes Neg Hx   . Heart disease Neg Hx   . Cancer Neg Hx     Social History   Tobacco Use  . Smoking status:  Never Smoker  . Smokeless tobacco: Never Used  Substance Use Topics  . Alcohol use: Yes    Alcohol/week: 2.0 standard drinks    Types: 2 Glasses of wine per week    Comment: weekly    ROS Review of Systems  Constitutional: Positive for fatigue (Improved). Negative for chills and fever.  HENT: Negative for sore throat and trouble swallowing.   Eyes: Negative for photophobia and visual disturbance.  Respiratory: Negative for cough and shortness of breath.   Cardiovascular: Negative for chest pain and palpitations.  Gastrointestinal: Negative for abdominal pain, constipation, diarrhea, nausea and rectal pain.  Endocrine: Negative for polydipsia, polyphagia and polyuria.  Genitourinary: Negative for dysuria and frequency.  Musculoskeletal: Negative for arthralgias and back pain.  Neurological: Negative for dizziness and headaches.  Hematological: Negative for adenopathy. Does not bruise/bleed easily.  Psychiatric/Behavioral: Negative for self-injury and suicidal ideas. The patient is nervous/anxious.     Objective:   Today's Vitals: BP (!) 167/108 (BP Location: Left Arm, Patient Position: Sitting, Cuff Size: Large)   Pulse (!) 110   Temp 98.4 F (36.9 C)   Resp 20   Wt 167 lb 6.4 oz (75.9 kg)   LMP 11/13/2019   SpO2 100%   BMI 26.61 kg/m   Physical Exam Vitals and nursing note reviewed.  Constitutional:      General: She is not in acute distress.    Appearance: Normal  appearance. She is not ill-appearing.  Neck:     Vascular: No carotid bruit.  Cardiovascular:     Rate and Rhythm: Normal rate and regular rhythm.  Pulmonary:     Effort: Pulmonary effort is normal.     Breath sounds: Normal breath sounds.  Abdominal:     Palpations: Abdomen is soft.     Tenderness: There is no abdominal tenderness. There is no right CVA tenderness, left CVA tenderness, guarding or rebound.  Musculoskeletal:     Cervical back: Neck supple.     Right lower leg: No edema.     Left lower  leg: No edema.  Lymphadenopathy:     Cervical: No cervical adenopathy.  Skin:    General: Skin is warm and dry.  Neurological:     General: No focal deficit present.     Mental Status: She is alert and oriented to person, place, and time.  Psychiatric:        Mood and Affect: Mood normal.        Behavior: Behavior normal.     Assessment & Plan:  1. Elevated blood-pressure reading without diagnosis of hypertension Patient's blood pressure remains elevated at today's visit and recheck blood pressure is 154/90.  She does likely have some component of whitecoat hypertension as she reports that her home blood pressures are generally in the 130s to 140s over 80s to 90s and occasionally in the 120s over 70s.  She reports that since her last visit she has been making dietary changes and new educational material on DASH diet provided as part of today's after visit summary.  She reports that she has lost some weight since last visit and has had resolution of headaches and dizziness/lightheadedness.  She wishes to have additional time to try dietary changes and weight loss to help lower her blood pressure and does not want to be on any medication at this time.  I did discuss with the patient that since she had elevated blood pressure as well as elevated heart rate that EKG could be done today to make sure that there were no abnormalities and a beta-blocker medication could be prescribed that would help not only to lower blood pressure but also to help with lowered heart rate and this sometimes actually helps as well with anxiety as patient does feel that at times when she is anxious she has an increase in her heart rate.  She does not wish to start any medication at this time and request a follow-up in an additional 3 to 4 weeks for blood pressure recheck.  We will schedule patient for blood pressure recheck with clinical pharmacist in the next 3 to 4 weeks and office follow-up.  Discussed with the patient that  at her recent visit she had normal TSH, normal basic metabolic panel and normal hemoglobin A1c.  2. Microcytic anemia Patient with history of anemia and at her most recent visit, she had hemoglobin of 10.9 with MCV of 74.  Discussed with patient that she likely has an iron deficiency component to her anemia in addition to her history of alpha thalassemia.  She reports that she did start an over-the-counter multivitamin with iron but does not wish to take an extra iron supplement this time.  We will recheck CBC at her next visit as well as iron panel.  No outpatient encounter medications on file as of 11/15/2019.   No facility-administered encounter medications on file as of 11/15/2019.    An After Visit Summary  was printed and given to the patient.   Follow-up: Return in about 4 weeks (around 12/13/2019) for BP-CPP/Luke in 3-4 weeks.    Cain Saupe MD

## 2019-12-20 ENCOUNTER — Ambulatory Visit: Payer: Managed Care, Other (non HMO) | Admitting: Pharmacist

## 2020-01-10 ENCOUNTER — Ambulatory Visit: Payer: Managed Care, Other (non HMO) | Admitting: Pharmacist

## 2020-02-14 ENCOUNTER — Ambulatory Visit: Payer: Managed Care, Other (non HMO) | Admitting: Pharmacist

## 2020-04-07 ENCOUNTER — Telehealth: Payer: Self-pay

## 2020-04-07 NOTE — Telephone Encounter (Signed)
Pt brought Pr-Employment Health Examination form for provider to complete. Pt las seen Feb & March 2021. Please call pt once form ready for pick up. [phone].

## 2020-04-07 NOTE — Telephone Encounter (Signed)
Form has been received.

## 2020-04-09 NOTE — Telephone Encounter (Signed)
Paperwork has been given to Dr.Newlin awaiting signatures patient will be contacted once paperwork is ready

## 2020-04-09 NOTE — Telephone Encounter (Signed)
Pt following up on form she dropped off.  Needs to get completed this week.  Her dr is Dr Jillyn Hidden.  Pt needs to know something asap.  Will another dr fill out? Please call her as soon as you can.

## 2020-04-10 ENCOUNTER — Ambulatory Visit: Payer: Self-pay | Admitting: *Deleted

## 2020-04-10 NOTE — Telephone Encounter (Signed)
Patient submitted a form that asked about her immunization records.  Patient has found  record for TDAP when she has TDAP vaccine in 2015.  Does she still need OV to complete form or patient would like to know can she submit TB record for form completion?  Please advise with the paitent CB- [phone]

## 2020-04-10 NOTE — Telephone Encounter (Signed)
Patient called checking on the status of message below, informed patient please allow 3 to 7 business day if not sooner. Patient would like form expedited, please advise best [phone]

## 2020-04-10 NOTE — Telephone Encounter (Signed)
Patient was called and informed that an OV is needed for paperwork to be signed.  She has been set up with an appointment for 04/16/2020

## 2020-04-10 NOTE — Telephone Encounter (Signed)
Patient is calling to ask advice - if she is needing to get a TDAP vaccine again Is she able to receive it while awaiting her second dose of COVID vaccine #2. Patient received Vaccine #1 this week on Tuesday. Please advise with patient   Patient called to review questions to receive TDAP since receiving covid vaccine. Patient reports she has received  TDAP immuniztion in 2015 and  is requesting to bring in verification of TDAP immunization record to  [facility] in her chart. Does patient still need OV on 04/16/20? Please advise .  Ok to Call tomorrow if possible.

## 2020-04-10 NOTE — Telephone Encounter (Signed)
Per previous note pt has been notified of scheduled appt.

## 2020-04-16 ENCOUNTER — Encounter: Payer: Self-pay | Admitting: Internal Medicine

## 2020-04-16 ENCOUNTER — Ambulatory Visit: Payer: Managed Care, Other (non HMO) | Attending: Internal Medicine | Admitting: Internal Medicine

## 2020-04-16 ENCOUNTER — Other Ambulatory Visit: Payer: Self-pay

## 2020-04-16 DIAGNOSIS — I10 Essential (primary) hypertension: Secondary | ICD-10-CM | POA: Diagnosis not present

## 2020-04-16 MED ORDER — AMLODIPINE BESYLATE 5 MG PO TABS: 5.0000 mg | ORAL_TABLET | Freq: Every day | ORAL | 3 refills | Status: DC

## 2020-04-16 NOTE — Assessment & Plan Note (Signed)
Discussed at length.  She is quite knowledgeable and has multiple good questions. In summary, she needs to be treated to BP <130/85. Discussed [employer] trial.  Amlodipine 5 mg po qd Side effects discussed (edema).

## 2020-04-16 NOTE — Progress Notes (Signed)
Completed work forms  She is concerned, appropriately, with bp. Home bps 14-156/90s. Reviewed bps in flow sheets: 150s-180s,90s-110.  She has no sxs - maybe once in a while gets lightheaded after eating specific foods(raisins).  Past Medical History:  Diagnosis Date  . Anemia   . Thalassemia trait     Social History   Socioeconomic History  . Marital status: Married    Spouse name: Not on file  . Number of children: 1  . Years of education: Not on file  . Highest education level: Not on file  Occupational History  . Not on file  Tobacco Use  . Smoking status: Never Smoker  . Smokeless tobacco: Never Used  Vaping Use  . Vaping Use: Never used  Substance and Sexual Activity  . Alcohol use: Yes    Alcohol/week: 2.0 standard drinks    Types: 2 Glasses of wine per week    Comment: weekly  . Drug use: Not Currently  . Sexual activity: Not on file  Other Topics Concern  . Not on file  Social History Narrative  . Not on file   Social Determinants of Health   Financial Resource Strain:   . Difficulty of Paying Living Expenses:   Food Insecurity:   . Worried About Programme researcher, broadcasting/film/video in the Last Year:   . Barista in the Last Year:   Transportation Needs:   . Freight forwarder (Medical):   Marland Kitchen Lack of Transportation (Non-Medical):   Physical Activity:   . Days of Exercise per Week:   . Minutes of Exercise per Session:   Stress:   . Feeling of Stress :   Social Connections:   . Frequency of Communication with Friends and Family:   . Frequency of Social Gatherings with Friends and Family:   . Attends Religious Services:   . Active Member of Clubs or Organizations:   . Attends Banker Meetings:   Marland Kitchen Marital Status:   Intimate Partner Violence:   . Fear of Current or Ex-Partner:   . Emotionally Abused:   Marland Kitchen Physically Abused:   . Sexually Abused:     Past Surgical History:  Procedure Laterality Date  . CESAREAN SECTION  2014  .  laparoscopy      Family History  Problem Relation Age of Onset  . Renal Disease Paternal Grandmother   . Stroke Paternal Grandfather   . Diabetes Neg Hx   . Heart disease Neg Hx   . Cancer Neg Hx     No Known Allergies  No current outpatient medications on file prior to visit.   No current facility-administered medications on file prior to visit.     patient denies chest pain, shortness of breath, orthopnea. Denies lower extremity edema, abdominal pain, change in appetite, change in bowel movements. Patient denies rashes, musculoskeletal complaints. No other specific complaints in a complete review of systems.   BP (!) 188/110   Pulse 88   Temp 98.4 F (36.9 C)   Resp 16   Ht 5\' 6"  (1.676 m)   Wt 161 lb 3.2 oz (73.1 kg)   SpO2 100%   BMI 26.02 kg/m   Well-developed well-nourished female in no acute distress. HEENT exam atraumatic, normocephalic, extraocular muscles are intact. Neck is supple. No jugular venous distention no thyromegaly. Chest clear to auscultation without increased work of breathing. Cardiac exam S1 and S2 are regular. Abdominal exam active bowel sounds, soft, nontender. Extremities no edema. Neurologic exam she  is alert without any motor sensory deficits. Gait is normal.

## 2020-05-19 ENCOUNTER — Ambulatory Visit: Payer: Managed Care, Other (non HMO) | Admitting: Family Medicine

## 2020-05-27 ENCOUNTER — Other Ambulatory Visit: Payer: Self-pay

## 2020-05-27 ENCOUNTER — Ambulatory Visit: Payer: Managed Care, Other (non HMO) | Attending: Family Medicine | Admitting: Family Medicine

## 2020-05-27 ENCOUNTER — Encounter: Payer: Self-pay | Admitting: Family Medicine

## 2020-05-27 VITALS — BP 145/82 | HR 95 | Ht 66.0 in | Wt 158.2 lb

## 2020-05-27 DIAGNOSIS — I10 Essential (primary) hypertension: Secondary | ICD-10-CM | POA: Diagnosis not present

## 2020-05-27 NOTE — Progress Notes (Signed)
Wants to discuss medications.

## 2020-05-27 NOTE — Progress Notes (Signed)
Subjective:  Patient ID: Alyssa Dunn, female    DOB: Feb 09, 1971  Age: 49 y.o. MRN: [id number]  CC: Hypertension   HPI Alyssa Dunn is a 49 year old female patient of Dr Jillyn Hidden with a history of hypertension who was started on amlodipine at her last office visit with Dr. Cato Mulligan.  She presents today with questions about how long she will need to be on amlodipine for.  Endorses compliance with her medication her blood pressure is 145/82. She has been exercising and trying to eat healthy and has lost about 20 to 25 pounds over the last year. She has no additional concerns today.  Past Medical History:  Diagnosis Date  . Anemia   . Thalassemia trait     Past Surgical History:  Procedure Laterality Date  . CESAREAN SECTION  2014  . laparoscopy      Family History  Problem Relation Age of Onset  . Renal Disease Paternal Grandmother   . Stroke Paternal Grandfather   . Diabetes Neg Hx   . Heart disease Neg Hx   . Cancer Neg Hx     No Known Allergies  Outpatient Medications Prior to Visit  Medication Sig Dispense Refill  . amLODipine (NORVASC) 5 MG tablet Take 1 tablet (5 mg total) by mouth daily. 90 tablet 3   No facility-administered medications prior to visit.     ROS Review of Systems  Constitutional: Negative for activity change, appetite change and fatigue.  HENT: Negative for congestion, sinus pressure and sore throat.   Eyes: Negative for visual disturbance.  Respiratory: Negative for cough, chest tightness, shortness of breath and wheezing.   Cardiovascular: Negative for chest pain and palpitations.  Gastrointestinal: Negative for abdominal distention, abdominal pain and constipation.  Endocrine: Negative for polydipsia.  Genitourinary: Negative for dysuria and frequency.  Musculoskeletal: Negative for arthralgias and back pain.  Skin: Negative for rash.  Neurological: Negative for tremors, light-headedness and numbness.  Hematological: Does not  bruise/bleed easily.  Psychiatric/Behavioral: Negative for agitation and behavioral problems.    Objective:  BP (!) 145/82   Pulse 95   Ht 5\' 6"  (1.676 m)   Wt 158 lb 3.2 oz (71.8 kg)   SpO2 100%   BMI 25.53 kg/m   BP/Weight 05/27/2020 04/16/2020 11/15/2019  Systolic BP 145 188 154  Diastolic BP 82 110 90  Wt. (Lbs) 158.2 161.2 167.4  BMI 25.53 26.02 26.61      Physical Exam Constitutional:      Appearance: She is well-developed.  Neck:     Vascular: No JVD.  Cardiovascular:     Rate and Rhythm: Normal rate.     Heart sounds: Normal heart sounds. No murmur heard.   Pulmonary:     Effort: Pulmonary effort is normal.     Breath sounds: Normal breath sounds. No wheezing or rales.  Chest:     Chest wall: No tenderness.  Abdominal:     General: Bowel sounds are normal. There is no distension.     Palpations: Abdomen is soft. There is no mass.     Tenderness: There is no abdominal tenderness.  Musculoskeletal:        General: Normal range of motion.     Right lower leg: No edema.     Left lower leg: No edema.  Neurological:     Mental Status: She is alert and oriented to person, place, and time.  Psychiatric:        Mood and Affect: Mood normal.  CMP Latest Ref Rng & Units 10/11/2019 06/28/2009  Glucose 65 - 99 mg/dL 79 -  BUN 6 - 24 mg/dL 11 11  Creatinine 4.27 - 1.00 mg/dL 0.62(B) 7.62  Sodium 831 - 144 mmol/L 137 -  Potassium 3.5 - 5.2 mmol/L 4.7 -  Chloride 96 - 106 mmol/L 103 -  CO2 20 - 29 mmol/L 19(L) -  Calcium 8.7 - 10.2 mg/dL 9.1 -  AST 0 - 37 U/L - 18    Lipid Panel     Component Value Date/Time   CHOL 128 10/11/2019 1109   TRIG 40 10/11/2019 1109   HDL 60 10/11/2019 1109   CHOLHDL 2.1 10/11/2019 1109   LDLCALC 58 10/11/2019 1109    CBC    Component Value Date/Time   WBC 6.0 10/11/2019 1109   WBC 9.7 06/28/2009 1610   RBC 4.98 10/11/2019 1109   RBC 4.15 06/28/2009 1610   HGB 10.9 (L) 10/11/2019 1109   HCT 36.7 10/11/2019 1109   PLT  CANCELED 10/11/2019 1109   MCV 74 (L) 10/11/2019 1109   MCH 21.9 (L) 10/11/2019 1109   MCHC 29.7 (L) 10/11/2019 1109   MCHC 32.5 06/28/2009 1610   RDW 18.3 (H) 10/11/2019 1109   LYMPHSABS 1.2 06/28/2009 1610   MONOABS 0.4 06/28/2009 1610   EOSABS 0.0 06/28/2009 1610   BASOSABS 0.0 06/28/2009 1610    Lab Results  Component Value Date   HGBA1C 5.4 10/11/2019    Assessment & Plan:  1. Essential hypertension Blood pressure is slightly above goal We have discussed blood pressure target and the fact that at this time she might need to remain on her antihypertensive. If her blood pressures start to trend towards the hypotensive range advised that there is a possibility of decreasing her amlodipine from 5 mg to 2.5 mg.  Advised that if her antihypertensive was discontinued she could have a rebound hypertension. Through joint decision-making she is agreeable. Counseled on blood pressure goal of less than 130/80, low-sodium, DASH diet, medication compliance, 150 minutes of moderate intensity exercise per week. Discussed medication compliance, adverse effects.     No orders of the defined types were placed in this encounter.   Follow-up: Return in about 3 months (around 08/26/2020) for Dr Jillyn Hidden- F/u HTN.       Hoy Register, MD, FAAFP. [facility] [place], [place] [phone]   05/27/2020, 11:45 AM

## 2020-05-27 NOTE — Patient Instructions (Signed)
   Managing Your Hypertension Hypertension is commonly called high blood pressure. This is when the force of your blood pressing against the walls of your arteries is too strong. Arteries are blood vessels that carry blood from your heart throughout your body. Hypertension forces the heart to work harder to pump blood, and may cause the arteries to become narrow or stiff. Having untreated or uncontrolled hypertension can cause heart attack, stroke, kidney disease, and other problems. What are blood pressure readings? A blood pressure reading consists of a higher number over a lower number. Ideally, your blood pressure should be below 120/80. The first ("top") number is called the systolic pressure. It is a measure of the pressure in your arteries as your heart beats. The second ("bottom") number is called the diastolic pressure. It is a measure of the pressure in your arteries as the heart relaxes. What does my blood pressure reading mean? Blood pressure is classified into four stages. Based on your blood pressure reading, your health care provider may use the following stages to determine what type of treatment you need, if any. Systolic pressure and diastolic pressure are measured in a unit called mm Hg. Normal  Systolic pressure: below 120.  Diastolic pressure: below 80. Elevated  Systolic pressure: 120-129.  Diastolic pressure: below 80. Hypertension stage 1  Systolic pressure: 130-139.  Diastolic pressure: 80-89. Hypertension stage 2  Systolic pressure: 140 or above.  Diastolic pressure: 90 or above. What health risks are associated with hypertension? Managing your hypertension is an important responsibility. Uncontrolled hypertension can lead to:  A heart attack.  A stroke.  A weakened blood vessel (aneurysm).  Heart failure.  Kidney damage.  Eye damage.  Metabolic syndrome.  Memory and concentration problems. What changes can I make to manage my  hypertension? Hypertension can be managed by making lifestyle changes and possibly by taking medicines. Your health care provider will help you make a plan to bring your blood pressure within a normal range. Eating and drinking   Eat a diet that is high in fiber and potassium, and low in salt (sodium), added sugar, and fat. An example eating plan is called the DASH (Dietary Approaches to Stop Hypertension) diet. To eat this way: ? Eat plenty of fresh fruits and vegetables. Try to fill half of your plate at each meal with fruits and vegetables. ? Eat whole grains, such as whole wheat pasta, brown rice, or whole grain bread. Fill about one quarter of your plate with whole grains. ? Eat low-fat diary products. ? Avoid fatty cuts of meat, processed or cured meats, and poultry with skin. Fill about one quarter of your plate with lean proteins such as fish, chicken without skin, beans, eggs, and tofu. ? Avoid premade and processed foods. These tend to be higher in sodium, added sugar, and fat.  Reduce your daily sodium intake. Most people with hypertension should eat less than 1,500 mg of sodium a day.  Limit alcohol intake to no more than 1 drink a day for nonpregnant women and 2 drinks a day for men. One drink equals 12 oz of beer, 5 oz of wine, or 1 oz of hard liquor. Lifestyle  Work with your health care provider to maintain a healthy body weight, or to lose weight. Ask what an ideal weight is for you.  Get at least 30 minutes of exercise that causes your heart to beat faster (aerobic exercise) most days of the week. Activities may include walking, swimming, or biking.    Include exercise to strengthen your muscles (resistance exercise), such as weight lifting, as part of your weekly exercise routine. Try to do these types of exercises for 30 minutes at least 3 days a week.  Do not use any products that contain nicotine or tobacco, such as cigarettes and e-cigarettes. If you need help quitting,  ask your health care provider.  Control any long-term (chronic) conditions you have, such as high cholesterol or diabetes. Monitoring  Monitor your blood pressure at home as told by your health care provider. Your personal target blood pressure may vary depending on your medical conditions, your age, and other factors.  Have your blood pressure checked regularly, as often as told by your health care provider. Working with your health care provider  Review all the medicines you take with your health care provider because there may be side effects or interactions.  Talk with your health care provider about your diet, exercise habits, and other lifestyle factors that may be contributing to hypertension.  Visit your health care provider regularly. Your health care provider can help you create and adjust your plan for managing hypertension. Will I need medicine to control my blood pressure? Your health care provider may prescribe medicine if lifestyle changes are not enough to get your blood pressure under control, and if:  Your systolic blood pressure is 130 or higher.  Your diastolic blood pressure is 80 or higher. Take medicines only as told by your health care provider. Follow the directions carefully. Blood pressure medicines must be taken as prescribed. The medicine does not work as well when you skip doses. Skipping doses also puts you at risk for problems. Contact a health care provider if:  You think you are having a reaction to medicines you have taken.  You have repeated (recurrent) headaches.  You feel dizzy.  You have swelling in your ankles.  You have trouble with your vision. Get help right away if:  You develop a severe headache or confusion.  You have unusual weakness or numbness, or you feel faint.  You have severe pain in your chest or abdomen.  You vomit repeatedly.  You have trouble breathing. Summary  Hypertension is when the force of blood pumping  through your arteries is too strong. If this condition is not controlled, it may put you at risk for serious complications.  Your personal target blood pressure may vary depending on your medical conditions, your age, and other factors. For most people, a normal blood pressure is less than 120/80.  Hypertension is managed by lifestyle changes, medicines, or both. Lifestyle changes include weight loss, eating a healthy, low-sodium diet, exercising more, and limiting alcohol. This information is not intended to replace advice given to you by your health care provider. Make sure you discuss any questions you have with your health care provider. Document Revised: 12/15/2018 Document Reviewed: 07/21/2016 Elsevier Patient Education  2020 Elsevier Inc.  

## 2020-08-27 ENCOUNTER — Ambulatory Visit: Payer: Managed Care, Other (non HMO) | Admitting: Family Medicine

## 2020-10-02 ENCOUNTER — Other Ambulatory Visit: Payer: Self-pay

## 2020-10-02 ENCOUNTER — Encounter: Payer: Self-pay | Admitting: Family Medicine

## 2020-10-02 ENCOUNTER — Ambulatory Visit: Payer: Managed Care, Other (non HMO) | Attending: Family Medicine | Admitting: Family Medicine

## 2020-10-02 VITALS — BP 132/80 | HR 99 | Ht 66.0 in | Wt 137.8 lb

## 2020-10-02 DIAGNOSIS — D509 Iron deficiency anemia, unspecified: Secondary | ICD-10-CM | POA: Diagnosis not present

## 2020-10-02 DIAGNOSIS — I10 Essential (primary) hypertension: Secondary | ICD-10-CM | POA: Diagnosis not present

## 2020-10-02 MED ORDER — AMLODIPINE BESYLATE 2.5 MG PO TABS: 2.5000 mg | ORAL_TABLET | Freq: Every day | ORAL | 1 refills | Status: DC

## 2020-10-02 NOTE — Patient Instructions (Signed)

## 2020-10-02 NOTE — Progress Notes (Signed)
Pt would like to get blood work done.

## 2020-10-02 NOTE — Progress Notes (Signed)
Subjective:  Patient ID: Alyssa Dunn, female    DOB: 16-Oct-1970  Age: 50 y.o. MRN: [id number]  CC: Hypertension   HPI Alyssa Dunn is a 50 year old female with a history of hypertension who presents today for chronic disease management.  Ambulatory BP have been 117/70s and at other times she has had a systolic of 779 she has and she is wondering if her Amlodipine can be reduced.  She is hoping she can eventually come off her antihypertensive. She would like to have labs because she has been anemic and has been using OTC multivitamins. She has no additional concerns today.  Past Medical History:  Diagnosis Date  . Anemia   . Thalassemia trait     Past Surgical History:  Procedure Laterality Date  . CESAREAN SECTION  2014  . laparoscopy      Family History  Problem Relation Age of Onset  . Renal Disease Paternal Grandmother   . Stroke Paternal Grandfather   . Diabetes Neg Hx   . Heart disease Neg Hx   . Cancer Neg Hx     No Known Allergies  Outpatient Medications Prior to Visit  Medication Sig Dispense Refill  . amLODipine (NORVASC) 5 MG tablet Take 1 tablet (5 mg total) by mouth daily. 90 tablet 3   No facility-administered medications prior to visit.     ROS Review of Systems  Constitutional: Negative for activity change, appetite change and fatigue.  HENT: Negative for congestion, sinus pressure and sore throat.   Eyes: Negative for visual disturbance.  Respiratory: Negative for cough, chest tightness, shortness of breath and wheezing.   Cardiovascular: Negative for chest pain and palpitations.  Gastrointestinal: Negative for abdominal distention, abdominal pain and constipation.  Endocrine: Negative for polydipsia.  Genitourinary: Negative for dysuria and frequency.  Musculoskeletal: Negative for arthralgias and back pain.  Skin: Negative for rash.  Neurological: Negative for tremors, light-headedness and numbness.  Hematological: Does not  bruise/bleed easily.  Psychiatric/Behavioral: Negative for agitation and behavioral problems.    Objective:  BP 132/80   Pulse 99   Ht 5' 6" (1.676 m)   Wt 137 lb 12.8 oz (62.5 kg)   SpO2 100%   BMI 22.24 kg/m   BP/Weight 10/02/2020 05/27/2020 3/90/3009  Systolic BP 233 007 622  Diastolic BP 80 82 633  Wt. (Lbs) 137.8 158.2 161.2  BMI 22.24 25.53 26.02      Physical Exam Constitutional:      Appearance: She is well-developed.  Neck:     Vascular: No JVD.  Cardiovascular:     Rate and Rhythm: Normal rate.     Heart sounds: Normal heart sounds. No murmur heard.   Pulmonary:     Effort: Pulmonary effort is normal.     Breath sounds: Normal breath sounds. No wheezing or rales.  Chest:     Chest wall: No tenderness.  Abdominal:     General: Bowel sounds are normal. There is no distension.     Palpations: Abdomen is soft. There is no mass.     Tenderness: There is no abdominal tenderness.  Musculoskeletal:        General: Normal range of motion.     Right lower leg: No edema.     Left lower leg: No edema.  Neurological:     Mental Status: She is alert and oriented to person, place, and time.  Psychiatric:        Mood and Affect: Mood normal.  CMP Latest Ref Rng & Units 10/11/2019 06/28/2009  Glucose 65 - 99 mg/dL 79 -  BUN 6 - 24 mg/dL 11 11  Creatinine 0.57 - 1.00 mg/dL 1.01(H) 0.70  Sodium 134 - 144 mmol/L 137 -  Potassium 3.5 - 5.2 mmol/L 4.7 -  Chloride 96 - 106 mmol/L 103 -  CO2 20 - 29 mmol/L 19(L) -  Calcium 8.7 - 10.2 mg/dL 9.1 -  AST 0 - 37 U/L - 18    Lipid Panel     Component Value Date/Time   CHOL 128 10/11/2019 1109   TRIG 40 10/11/2019 1109   HDL 60 10/11/2019 1109   CHOLHDL 2.1 10/11/2019 1109   LDLCALC 58 10/11/2019 1109    CBC    Component Value Date/Time   WBC 6.0 10/11/2019 1109   WBC 9.7 06/28/2009 1610   RBC 4.98 10/11/2019 1109   RBC 4.15 06/28/2009 1610   HGB 10.9 (L) 10/11/2019 1109   HCT 36.7 10/11/2019 1109   PLT  CANCELED 10/11/2019 1109   MCV 74 (L) 10/11/2019 1109   MCH 21.9 (L) 10/11/2019 1109   MCHC 29.7 (L) 10/11/2019 1109   MCHC 32.5 06/28/2009 1610   RDW 18.3 (H) 10/11/2019 1109   LYMPHSABS 1.2 06/28/2009 1610   MONOABS 0.4 06/28/2009 1610   EOSABS 0.0 06/28/2009 1610   BASOSABS 0.0 06/28/2009 1610    Lab Results  Component Value Date   HGBA1C 5.4 10/11/2019    Assessment & Plan:  1. Essential hypertension Controlled Decreased amlodipine dose per patient request Advised that multivitamins cannot substitute her antihypertensive She will continue with ambulatory blood pressure monitoring Counseled on blood pressure goal of less than 130/80, low-sodium, DASH diet, medication compliance, 150 minutes of moderate intensity exercise per week. Discussed medication compliance, adverse effects. - amLODipine (NORVASC) 2.5 MG tablet; Take 1 tablet (2.5 mg total) by mouth daily.  Dispense: 90 tablet; Refill: 1 - CMP14+EGFR  2. Microcytic anemia Uncontrolled Last hemoglobin was 10.9 We will repeat in place in for COPD if indicated - CBC with Differential/Platelet   Health Care Maintenance: We will address at next visit Meds ordered this encounter  Medications  . amLODipine (NORVASC) 2.5 MG tablet    Sig: Take 1 tablet (2.5 mg total) by mouth daily.    Dispense:  90 tablet    Refill:  1    Dose change    Follow-up: Return in about 1 month (around 11/02/2020) for Complete physical exam.       Charlott Rakes, MD, FAAFP. [facility] [place], [place]   10/02/2020, 9:33 AM

## 2020-10-03 ENCOUNTER — Other Ambulatory Visit: Payer: Self-pay | Admitting: Family Medicine

## 2020-10-03 DIAGNOSIS — D649 Anemia, unspecified: Secondary | ICD-10-CM

## 2020-10-03 LAB — CBC WITH DIFFERENTIAL/PLATELET
Basophils Absolute: 0 10*3/uL (ref 0.0–0.2)
Basos: 0 %
EOS (ABSOLUTE): 0 10*3/uL (ref 0.0–0.4)
Eos: 1 %
Hematocrit: 35.1 % (ref 34.0–46.6)
Hemoglobin: 10.5 g/dL — ABNORMAL LOW (ref 11.1–15.9)
Immature Grans (Abs): 0 10*3/uL (ref 0.0–0.1)
Immature Granulocytes: 0 %
Lymphocytes Absolute: 1.5 10*3/uL (ref 0.7–3.1)
Lymphs: 29 %
MCH: 22.4 pg — ABNORMAL LOW (ref 26.6–33.0)
MCHC: 29.9 g/dL — ABNORMAL LOW (ref 31.5–35.7)
MCV: 75 fL — ABNORMAL LOW (ref 79–97)
Monocytes Absolute: 0.4 10*3/uL (ref 0.1–0.9)
Monocytes: 7 %
Neutrophils Absolute: 3.2 10*3/uL (ref 1.4–7.0)
Neutrophils: 63 %
RBC: 4.69 x10E6/uL (ref 3.77–5.28)
RDW: 17.5 % — ABNORMAL HIGH (ref 11.7–15.4)
WBC: 5.2 10*3/uL (ref 3.4–10.8)

## 2020-10-03 LAB — CMP14+EGFR
ALT: 13 IU/L (ref 0–32)
AST: 27 IU/L (ref 0–40)
Albumin/Globulin Ratio: 1.3 (ref 1.2–2.2)
Albumin: 4.3 g/dL (ref 3.8–4.8)
Alkaline Phosphatase: 86 IU/L (ref 44–121)
BUN/Creatinine Ratio: 13 (ref 9–23)
BUN: 13 mg/dL (ref 6–24)
Bilirubin Total: 0.3 mg/dL (ref 0.0–1.2)
CO2: 20 mmol/L (ref 20–29)
Calcium: 9.3 mg/dL (ref 8.7–10.2)
Chloride: 102 mmol/L (ref 96–106)
Creatinine, Ser: 0.97 mg/dL (ref 0.57–1.00)
GFR calc Af Amer: 80 mL/min/{1.73_m2} (ref >59)
GFR calc non Af Amer: 70 mL/min/{1.73_m2} (ref >59)
Globulin, Total: 3.2 g/dL (ref 1.5–4.5)
Glucose: 82 mg/dL (ref 65–99)
Potassium: 4.5 mmol/L (ref 3.5–5.2)
Sodium: 137 mmol/L (ref 134–144)
Total Protein: 7.5 g/dL (ref 6.0–8.5)

## 2020-10-03 MED ORDER — FERROUS SULFATE 325 (65 FE) MG PO TBEC: 325.0000 mg | DELAYED_RELEASE_TABLET | Freq: Three times a day (TID) | ORAL | 3 refills | Status: DC

## 2020-10-06 ENCOUNTER — Telehealth: Payer: Self-pay | Admitting: Family Medicine

## 2020-10-06 NOTE — Telephone Encounter (Signed)
-----   Message from Hoy Register, MD sent at 10/03/2020  9:43 AM EST ----- Labs reveal she is anemic.  This is unchanged from previous labs and I have sent a prescription for ferrous sulfate to her pharmacy which can be constipating so she might want to use a laxative if constipation occurs.  Can you please have the lab add on TIBC and ferritin? Thanks

## 2020-10-06 NOTE — Telephone Encounter (Signed)
Patient name and DOB has been verified Patient was informed of lab results. Patient had no questions.  

## 2020-10-06 NOTE — Telephone Encounter (Signed)
Copied from CRM [id number]. Topic: General - Other >> Oct 06, 2020 11:47 AM Herby Abraham C wrote: Reason for CRM: pt called in for lab results. Pt says that she was prescribed a 2nd medication but is unsure of what medication is for? Pt says that she would like to discuss further.     [phone] -- CB:

## 2020-10-27 LAB — IRON,TIBC AND FERRITIN PANEL
Ferritin: 15 ng/mL (ref 15–150)
Iron Saturation: 64 % — ABNORMAL HIGH (ref 15–55)
Iron: 268 ug/dL (ref 27–159)
Total Iron Binding Capacity: 417 ug/dL (ref 250–450)
UIBC: 149 ug/dL (ref 131–425)

## 2020-10-27 LAB — SPECIMEN STATUS REPORT

## 2020-11-06 ENCOUNTER — Ambulatory Visit: Payer: Managed Care, Other (non HMO) | Admitting: Family Medicine

## 2021-01-15 ENCOUNTER — Ambulatory Visit: Payer: Managed Care, Other (non HMO) | Admitting: Family Medicine

## 2021-03-26 ENCOUNTER — Other Ambulatory Visit: Payer: Self-pay | Admitting: Family Medicine

## 2021-03-26 DIAGNOSIS — I10 Essential (primary) hypertension: Secondary | ICD-10-CM

## 2021-06-30 ENCOUNTER — Other Ambulatory Visit: Payer: Self-pay | Admitting: Family Medicine

## 2021-06-30 DIAGNOSIS — I10 Essential (primary) hypertension: Secondary | ICD-10-CM

## 2021-06-30 NOTE — Telephone Encounter (Signed)
Seen 1/27, canceled appt, already had curtesy refill.  Requested Prescriptions  Pending Prescriptions Disp Refills  . amLODipine (NORVASC) 2.5 MG tablet [Pharmacy Med Name: AMLODIPINE BESYLATE 2.5 MG TAB] 90 tablet 0    Sig: TAKE 1 TABLET BY MOUTH EVERY DAY     Cardiovascular:  Calcium Channel Blockers Failed - 06/30/2021  1:37 AM      Failed - Valid encounter within last 6 months    Recent Outpatient Visits          9 months ago Essential hypertension   [facility] Hoy Register, MD   1 year ago Essential hypertension   [facility] Hoy Register, MD   1 year ago Essential hypertension   [place] [facility] [place], Valetta Mole, MD   1 year ago Elevated blood-pressure reading without diagnosis of hypertension   [place] [facility] [place], [place], MD   1 year ago Blurred vision   [facility] [place], [place], MD             Passed - Last BP in normal range    BP Readings from Last 1 Encounters:  10/02/20 132/80

## 2021-08-01 ENCOUNTER — Other Ambulatory Visit: Payer: Self-pay | Admitting: Family Medicine

## 2021-08-01 DIAGNOSIS — I10 Essential (primary) hypertension: Secondary | ICD-10-CM

## 2021-08-02 NOTE — Telephone Encounter (Signed)
Requested Prescriptions  Pending Prescriptions Disp Refills  . amLODipine (NORVASC) 2.5 MG tablet [Pharmacy Med Name: AMLODIPINE BESYLATE 2.5 MG TAB] 30 tablet 0    Sig: TAKE 1 TABLET BY MOUTH EVERY DAY     Cardiovascular:  Calcium Channel Blockers Failed - 08/01/2021 11:12 AM      Failed - Valid encounter within last 6 months    Recent Outpatient Visits          10 months ago Essential hypertension   [facility] [place], Odette Horn[place], MD   1 year ago Essential hypertension   [place] Hoy Register, MD   1 year ago Essential hypertension   [place] [place], Valetta Mole, MD   1 year ago Elevated blood-pressure reading without diagnosis of hypertension   [place] [facility] [place], [place], MD   1 year ago Blurred vision    [facility] [place], [place], MD             Passed - Last BP in normal range    BP Readings from Last 1 Encounters:  10/02/20 132/80         Called pt and she has just 1 pill. Advised pt will fill for 30 days as a courtesy then no further refills until seen in office. Pt stated she will call Monday.

## 2021-08-11 ENCOUNTER — Telehealth: Payer: Self-pay | Admitting: Family Medicine

## 2021-08-11 NOTE — Telephone Encounter (Signed)
Provider Marylene Land out sick. Appt 12/8 need to be rescheduled left vm for patient to call [phone]

## 2021-08-13 ENCOUNTER — Ambulatory Visit: Payer: Managed Care, Other (non HMO) | Admitting: Physician Assistant

## 2021-08-24 ENCOUNTER — Other Ambulatory Visit: Payer: Self-pay | Admitting: Family Medicine

## 2021-08-24 DIAGNOSIS — I10 Essential (primary) hypertension: Secondary | ICD-10-CM

## 2021-08-27 ENCOUNTER — Encounter: Payer: Self-pay | Admitting: Physician Assistant

## 2021-08-27 ENCOUNTER — Ambulatory Visit: Payer: Managed Care, Other (non HMO) | Attending: Physician Assistant | Admitting: Physician Assistant

## 2021-08-27 ENCOUNTER — Other Ambulatory Visit: Payer: Self-pay

## 2021-08-27 VITALS — BP 137/90 | HR 92 | Ht 66.0 in | Wt 158.6 lb

## 2021-08-27 DIAGNOSIS — R0982 Postnasal drip: Secondary | ICD-10-CM | POA: Diagnosis not present

## 2021-08-27 DIAGNOSIS — D509 Iron deficiency anemia, unspecified: Secondary | ICD-10-CM

## 2021-08-27 DIAGNOSIS — I10 Essential (primary) hypertension: Secondary | ICD-10-CM

## 2021-08-27 MED ORDER — AMLODIPINE BESYLATE 2.5 MG PO TABS: 2.5000 mg | ORAL_TABLET | Freq: Every day | ORAL | 2 refills | Status: DC

## 2021-08-27 MED ORDER — FLUTICASONE PROPIONATE 50 MCG/ACT NA SUSP: 2.0000 | Freq: Every day | NASAL | 6 refills | Status: DC

## 2021-08-27 NOTE — Progress Notes (Signed)
Patient ID: Alyssa Dunn, female   DOB: [birth year], 50 y.o.   MRN: [id number]   Alyssa Dunn, is a 50 y.o. female  TAV:[id number]  [id number]:[id number]  DOB - 10/23/1970  Chief Complaint  Patient presents with   Hypertension       Subjective:   Alyssa Dunn is a 50 y.o. female here today for BP check.  She is not taking her iron tablets.  Still having heavy periods.  She really wants to be off all meds although when I rechecked her Bp manually today it was 142/92.  When she checks it at home, it is in the high 130s/80s but she does not know exact readings.  Also having PND/congestion No problems updated.  ALLERGIES: No Known Allergies  PAST MEDICAL HISTORY: Past Medical History:  Diagnosis Date   Anemia    Thalassemia trait     MEDICATIONS AT HOME: Prior to Admission medications   Medication Sig Start Date End Date Taking? Authorizing Provider  fluticasone (FLONASE) 50 MCG/ACT nasal spray Place 2 sprays into both nostrils daily. 08/27/21  Yes Georgian Co M, PA-C  amLODipine (NORVASC) 2.5 MG tablet Take 1 tablet (2.5 mg total) by mouth daily. 08/27/21   Anders Simmonds, PA-C  ferrous sulfate 325 (65 FE) MG EC tablet Take 1 tablet (325 mg total) by mouth 3 (three) times daily with meals. Patient not taking: Reported on 08/27/2021 10/03/20   Hoy Register, MD    ROS: Neg resp Neg cardiac Neg GI Neg GU Neg MS Neg psych Neg neuro  Objective:   Vitals:   08/27/21 1538  BP: 137/90  Pulse: 92  SpO2: 100%  Weight: 158 lb 9.6 oz (71.9 kg)  Height: 5\' 6"  (1.676 m)   Exam General appearance : Awake, alert, not in any distress. Speech Clear. Not toxic looking HEENT: Atraumatic and Normocephalic.  Nasal turbinates inflamed B and throat with PND.  No erythema/exudate Neck: Supple, no JVD. No cervical lymphadenopathy.  Chest: Good air entry bilaterally, CTAB.  No rales/rhonchi/wheezing CVS: S1 S2 regular, no murmurs.  Extremities: B/L Lower Ext shows no edema, both  legs are warm to touch Neurology: Awake alert, and oriented X 3, CN II-XII intact, Non focal Skin: No Rash  Data Review Lab Results  Component Value Date   HGBA1C 5.4 10/11/2019    Assessment & Plan   1. Microcytic anemia Will assess need for iron supplements - CBC with Differential/Platelet - Iron, TIBC and Ferritin Panel  2. Essential hypertension Doubt she will be able to come off of this-in fact, we may need to increase it.  However, FOR NOW, I will recommend her to continue amlodipine 2.5mg  daily and check BP daily and record.   - Basic metabolic panel - amLODipine (NORVASC) 2.5 MG tablet; Take 1 tablet (2.5 mg total) by mouth daily.  Dispense: 30 tablet; Refill: 2  3. Post-nasal drip Flonase sent    Patient have been counseled extensively about nutrition and exercise. Other issues discussed during this visit include: low cholesterol diet, weight control and daily exercise, foot care, annual eye examinations at Ophthalmology, importance of adherence with medications and regular follow-up. We also discussed long term complications of uncontrolled diabetes and hypertension.   Return for 3-4 weeks virtual visit with [facility] for BP.  The patient was given clear instructions to go to ER or return to medical center if symptoms don't improve, worsen or new problems develop. The patient verbalized understanding. The patient was told to call to get lab  results if they haven't heard anything in the next week.      Georgian Co, PA-C [facility] and [facility] [place], [place] [phone]   08/27/2021, 4:02 PM

## 2021-08-27 NOTE — Patient Instructions (Signed)
Check blood pressure daily after sitting for 5 mins and deep breathing/sitting still and record and have available for next visit.

## 2021-11-22 ENCOUNTER — Other Ambulatory Visit: Payer: Self-pay | Admitting: Physician Assistant

## 2021-11-22 DIAGNOSIS — I10 Essential (primary) hypertension: Secondary | ICD-10-CM

## 2021-11-23 NOTE — Telephone Encounter (Signed)
Pt called to report that the patient is running low, please advise ?

## 2021-11-24 NOTE — Telephone Encounter (Signed)
Requested Prescriptions  ?Pending Prescriptions Disp Refills  ?? amLODipine (NORVASC) 2.5 MG tablet [Pharmacy Med Name: AMLODIPINE BESYLATE 2.5 MG TAB] 90 tablet 0  ?  Sig: TAKE 1 TABLET BY MOUTH EVERY DAY  ?  ? Cardiovascular: Calcium Channel Blockers 2 Failed - 11/23/2021 11:07 AM  ?  ?  Failed - Last BP in normal range  ?  BP Readings from Last 1 Encounters:  ?08/27/21 137/90  ?   ?  ?  Passed - Last Heart Rate in normal range  ?  Pulse Readings from Last 1 Encounters:  ?08/27/21 92  ?   ?  ?  Passed - Valid encounter within last 6 months  ?  Recent Outpatient Visits   ?      ? 2 months ago Microcytic anemia  ? [facility] [place], [place], [place]  ? 1 year ago Essential hypertension  ? [facility] Hoy Register, MD  ? 1 year ago Essential hypertension  ? [facility] Hoy Register, [facility]  ? 1 year ago Essential hypertension  ? [facility] [place], Valetta Mole, MD  ? 2 years ago Elevated blood-pressure reading without diagnosis of hypertension  ? [facility] [place], [place], MD  ?  ?  ? ?  ?  ?  ? ? ?

## 2022-02-24 ENCOUNTER — Other Ambulatory Visit: Payer: Self-pay | Admitting: Physician Assistant

## 2022-02-24 DIAGNOSIS — I10 Essential (primary) hypertension: Secondary | ICD-10-CM

## 2022-03-25 ENCOUNTER — Other Ambulatory Visit: Payer: Self-pay | Admitting: Family Medicine

## 2022-03-25 DIAGNOSIS — I10 Essential (primary) hypertension: Secondary | ICD-10-CM

## 2022-05-21 ENCOUNTER — Other Ambulatory Visit: Payer: Self-pay | Admitting: Family Medicine

## 2022-05-21 DIAGNOSIS — I10 Essential (primary) hypertension: Secondary | ICD-10-CM

## 2022-06-14 MED ORDER — AMLODIPINE BESYLATE 2.5 MG PO TABS: 2.5000 mg | ORAL_TABLET | Freq: Every day | ORAL | 0 refills | Status: DC

## 2022-06-14 NOTE — Telephone Encounter (Signed)
Pt has scheduled appt for 09/20/21

## 2022-06-14 NOTE — Addendum Note (Signed)
Addended by: Matilde Sprang on: 06/14/2022 05:30 PM   Modules accepted: Orders

## 2022-06-14 NOTE — Telephone Encounter (Signed)
Courtesy refill until appointment.  Requested Prescriptions  Pending Prescriptions Disp Refills  . amLODipine (NORVASC) 2.5 MG tablet 90 tablet 0    Sig: Take 1 tablet (2.5 mg total) by mouth daily. MUST KEEP UPCOMING APPOINTMENT FOR ADDITIONAL REFILLS     Cardiovascular: Calcium Channel Blockers 2 Failed - 06/14/2022  5:30 PM      Failed - Last BP in normal range    BP Readings from Last 1 Encounters:  08/27/21 137/90         Failed - Valid encounter within last 6 months    Recent Outpatient Visits          9 months ago Microcytic anemia   [place], Dionne [place], [place]   1 year ago Essential hypertension    [facility] Charlott Rakes, MD   2 years ago Essential hypertension   [place], [place], MD   2 years ago Essential hypertension   [place], Darrick Penna, MD   2 years ago Elevated blood-pressure reading without diagnosis of hypertension   [place], [place]      Future Appointments            In 3 months Charlott Rakes, MD [place] - Last Heart Rate in normal range    Pulse Readings from Last 1 Encounters:  08/27/21 92         Refused Prescriptions Disp Refills  . amLODipine (NORVASC) 2.5 MG tablet [Pharmacy Med Name: AMLODIPINE BESYLATE 2.5 MG TAB] 30 tablet 0    Sig: TAKE 1 TABLET BY MOUTH EVERY DAY     Cardiovascular: Calcium Channel Blockers 2 Failed - 06/14/2022  5:30 PM      Failed - Last BP in normal range    BP Readings from Last 1 Encounters:  08/27/21 137/90         Failed - Valid encounter within last 6 months    Recent Outpatient Visits          9 months ago Microcytic anemia   [place], Dionne [place], [place]   1 year ago Essential hypertension   [place], [place], MD   2 years ago Essential hypertension   [place], [place], MD   2 years ago Essential hypertension   [place], Darrick Penna, MD   2 years ago Elevated blood-pressure reading without diagnosis of hypertension   [place], MD      Future Appointments            In 3 months Charlott Rakes, MD [place] - Last Heart Rate in normal range    Pulse Readings from Last 1 Encounters:  08/27/21 92

## 2022-09-20 ENCOUNTER — Ambulatory Visit: Payer: Managed Care, Other (non HMO) | Attending: Family Medicine | Admitting: Family Medicine

## 2022-09-20 ENCOUNTER — Encounter: Payer: Self-pay | Admitting: Family Medicine

## 2022-09-20 VITALS — BP 158/97 | HR 86 | Ht 66.0 in | Wt 177.4 lb

## 2022-09-20 DIAGNOSIS — I10 Essential (primary) hypertension: Secondary | ICD-10-CM

## 2022-09-20 DIAGNOSIS — D509 Iron deficiency anemia, unspecified: Secondary | ICD-10-CM | POA: Diagnosis not present

## 2022-09-20 DIAGNOSIS — Z1159 Encounter for screening for other viral diseases: Secondary | ICD-10-CM | POA: Diagnosis not present

## 2022-09-20 MED ORDER — AMLODIPINE BESYLATE 5 MG PO TABS: 5.0000 mg | ORAL_TABLET | Freq: Every day | ORAL | 1 refills | Status: DC

## 2022-09-20 NOTE — Progress Notes (Signed)
Wants to increase amlodipine to 5 MG

## 2022-09-20 NOTE — Progress Notes (Signed)
Subjective:  Patient ID: Alyssa Dunn, female    DOB: 1971/01/10  Age: 52 y.o. MRN: [id number]  CC: Hypertension   HPI Alyssa Dunn is a 52 y.o. year old female with a history of hypertension who presents today for chronic disease management.   Interval History:  BP at home has been elevated with diastolics in the 26Z she endorses adherence with amlodipine 2.5 mg.  In the past she was on 5 mg but had requested decreasing to 2.5 mg due to systolic blood pressures in the 110 range. She wears Invisalign and that causes her to be dehydrated and needing to drink lots of water and she is wondering if this has anything to do with her blood pressure. She endorses snacking eating lots of cheese and crackers and her diet has not been totally low-sodium. Exercising has been difficult as she transitioned from part-time to full-time teaching and is trying to get used to the routine.  She has a history of anemia and does not take any iron tabs. She has Thalassemia B trait. She also has heavy periods but over time this has improved.  She took iron tablets in the past but this bothered her stomach. Past Medical History:  Diagnosis Date   Anemia    Thalassemia trait     Past Surgical History:  Procedure Laterality Date   CESAREAN SECTION  2014   laparoscopy      Family History  Problem Relation Age of Onset   Renal Disease Paternal Grandmother    Stroke Paternal Grandfather    Diabetes Neg Hx    Heart disease Neg Hx    Cancer Neg Hx     Social History   Socioeconomic History   Marital status: Married    Spouse name: Not on file   Number of children: 1   Years of education: Not on file   Highest education level: Not on file  Occupational History   Not on file  Tobacco Use   Smoking status: Never   Smokeless tobacco: Never  Vaping Use   Vaping Use: Never used  Substance and Sexual Activity   Alcohol use: Yes    Alcohol/week: 2.0 standard drinks of alcohol    Types: 2  Glasses of wine per week    Comment: weekly   Drug use: Not Currently   Sexual activity: Not on file  Other Topics Concern   Not on file  Social History Narrative   Not on file   Social Determinants of Health   Financial Resource Strain: Not on file  Food Insecurity: Not on file  Transportation Needs: Not on file  Physical Activity: Not on file  Stress: Not on file  Social Connections: Not on file    No Known Allergies  Outpatient Medications Prior to Visit  Medication Sig Dispense Refill   amLODipine (NORVASC) 2.5 MG tablet Take 1 tablet (2.5 mg total) by mouth daily. MUST KEEP UPCOMING APPOINTMENT FOR ADDITIONAL REFILLS 90 tablet 0   ferrous sulfate 325 (65 FE) MG EC tablet Take 1 tablet (325 mg total) by mouth 3 (three) times daily with meals. (Patient not taking: Reported on 08/27/2021) 60 tablet 3   fluticasone (FLONASE) 50 MCG/ACT nasal spray Place 2 sprays into both nostrils daily. (Patient not taking: Reported on 09/20/2022) 16 g 6   No facility-administered medications prior to visit.     ROS Review of Systems  Constitutional:  Negative for activity change and appetite change.  HENT:  Negative  for sinus pressure and sore throat.   Respiratory:  Negative for chest tightness, shortness of breath and wheezing.   Cardiovascular:  Negative for chest pain and palpitations.  Gastrointestinal:  Negative for abdominal distention, abdominal pain and constipation.  Genitourinary: Negative.   Musculoskeletal: Negative.   Psychiatric/Behavioral:  Negative for behavioral problems and dysphoric mood.     Objective:  BP (!) 158/97   Pulse 86   Ht 5\' 6"  (1.676 m)   Wt 177 lb 6.4 oz (80.5 kg)   SpO2 100%   BMI 28.63 kg/m      09/20/2022    3:53 PM 08/27/2021    3:38 PM 10/02/2020    9:12 AM  BP/Weight  Systolic BP 557 322 025  Diastolic BP 97 90 80  Wt. (Lbs) 177.4 158.6 137.8  BMI 28.63 kg/m2 25.6 kg/m2 22.24 kg/m2      Physical Exam Constitutional:       Appearance: She is well-developed.  Cardiovascular:     Rate and Rhythm: Normal rate.     Heart sounds: Normal heart sounds. No murmur heard. Pulmonary:     Effort: Pulmonary effort is normal.     Breath sounds: Normal breath sounds. No wheezing or rales.  Chest:     Chest wall: No tenderness.  Abdominal:     General: Bowel sounds are normal. There is no distension.     Palpations: Abdomen is soft. There is no mass.     Tenderness: There is no abdominal tenderness.  Musculoskeletal:        General: Normal range of motion.     Right lower leg: No edema.     Left lower leg: No edema.  Neurological:     Mental Status: She is alert and oriented to person, place, and time.  Psychiatric:        Mood and Affect: Mood normal.        Latest Ref Rng & Units 10/02/2020   11:08 AM 10/11/2019   11:09 AM 06/28/2009    4:10 PM  CMP  Glucose 65 - 99 mg/dL 82  79    BUN 6 - 24 mg/dL 13  11  11    Creatinine 0.57 - 1.00 mg/dL 0.97  1.01  0.70   Sodium 134 - 144 mmol/L 137  137    Potassium 3.5 - 5.2 mmol/L 4.5  4.7    Chloride 96 - 106 mmol/L 102  103    CO2 20 - 29 mmol/L 20  19    Calcium 8.7 - 10.2 mg/dL 9.3  9.1    Total Protein 6.0 - 8.5 g/dL 7.5     Total Bilirubin 0.0 - 1.2 mg/dL 0.3     Alkaline Phos 44 - 121 IU/L 86     AST 0 - 40 IU/L 27   18   ALT 0 - 32 IU/L 13       Lipid Panel     Component Value Date/Time   CHOL 128 10/11/2019 1109   TRIG 40 10/11/2019 1109   HDL 60 10/11/2019 1109   CHOLHDL 2.1 10/11/2019 1109   LDLCALC 58 10/11/2019 1109    CBC    Component Value Date/Time   WBC 5.2 10/02/2020 1108   WBC 9.7 06/28/2009 1610   RBC 4.69 10/02/2020 1108   RBC 4.15 06/28/2009 1610   HGB 10.5 (L) 10/02/2020 1108   HCT 35.1 10/02/2020 1108   PLT CANCELED 10/02/2020 1108   MCV 75 (L) 10/02/2020 1108  MCH 22.4 (L) 10/02/2020 1108   MCHC 29.9 (L) 10/02/2020 1108   MCHC 32.5 06/28/2009 1610   RDW 17.5 (H) 10/02/2020 1108   LYMPHSABS 1.5 10/02/2020 1108    MONOABS 0.4 06/28/2009 1610   EOSABS 0.0 10/02/2020 1108   BASOSABS 0.0 10/02/2020 1108    Lab Results  Component Value Date   HGBA1C 5.4 10/11/2019    Assessment & Plan:  1. Essential hypertension Uncontrolled Amlodipine dose increased from 2.5 mg to 5 mg Counseled on blood pressure goal of less than 130/80, low-sodium, DASH diet, medication compliance, 150 minutes of moderate intensity exercise per week. Discussed medication compliance, adverse effects. - amLODipine (NORVASC) 5 MG tablet; Take 1 tablet (5 mg total) by mouth daily.  Dispense: 90 tablet; Refill: 1 - CMP14+EGFR  2. 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 Screening for viral disease - HCV Ab w Reflex to Quant PCR - HIV Antibody (routine testing w rflx)   Health Care Maintenance: Due to her schedule it is difficult for her to have this done.  She will come in the summer months for her routine healthcare maintenance when school is out.  Meds ordered this encounter  Medications   amLODipine (NORVASC) 5 MG tablet    Sig: Take 1 tablet (5 mg total) by mouth daily.    Dispense:  90 tablet    Refill:  1    Dose increase    Follow-up: Return in about 5 months (around 02/19/2023) for CPE/ Preventive Health Exam.       Hoy Register, MD, FAAFP. [facility] [place], [place] [phone]   09/20/2022, 5:17 PM

## 2022-09-20 NOTE — Patient Instructions (Signed)

## 2022-09-21 ENCOUNTER — Other Ambulatory Visit: Payer: Self-pay | Admitting: Family Medicine

## 2022-09-21 LAB — CBC WITH DIFFERENTIAL/PLATELET
Basophils Absolute: 0 10*3/uL (ref 0.0–0.2)
Basos: 1 %
EOS (ABSOLUTE): 0.1 10*3/uL (ref 0.0–0.4)
Eos: 1 %
Hematocrit: 31 % — ABNORMAL LOW (ref 34.0–46.6)
Hemoglobin: 9.3 g/dL — ABNORMAL LOW (ref 11.1–15.9)
Immature Grans (Abs): 0 10*3/uL (ref 0.0–0.1)
Immature Granulocytes: 0 %
Lymphocytes Absolute: 2 10*3/uL (ref 0.7–3.1)
Lymphs: 30 %
MCH: 20.8 pg — ABNORMAL LOW (ref 26.6–33.0)
MCHC: 30 g/dL — ABNORMAL LOW (ref 31.5–35.7)
MCV: 69 fL — ABNORMAL LOW (ref 79–97)
Monocytes Absolute: 0.4 10*3/uL (ref 0.1–0.9)
Monocytes: 5 %
Neutrophils Absolute: 4.2 10*3/uL (ref 1.4–7.0)
Neutrophils: 63 %
RBC: 4.47 x10E6/uL (ref 3.77–5.28)
RDW: 18.4 % — ABNORMAL HIGH (ref 11.7–15.4)
WBC: 6.6 10*3/uL (ref 3.4–10.8)

## 2022-09-21 LAB — HCV AB W REFLEX TO QUANT PCR: HCV Ab: NONREACTIVE

## 2022-09-21 LAB — CMP14+EGFR
ALT: 11 IU/L (ref 0–32)
AST: 20 IU/L (ref 0–40)
Albumin/Globulin Ratio: 1.3 (ref 1.2–2.2)
Albumin: 4.2 g/dL (ref 3.9–4.9)
Alkaline Phosphatase: 89 IU/L (ref 44–121)
BUN/Creatinine Ratio: 11 (ref 9–23)
BUN: 10 mg/dL (ref 6–24)
Bilirubin Total: <0.2 mg/dL (ref 0.0–1.2)
CO2: 21 mmol/L (ref 20–29)
Calcium: 9.1 mg/dL (ref 8.7–10.2)
Chloride: 103 mmol/L (ref 96–106)
Creatinine, Ser: 0.89 mg/dL (ref 0.57–1.00)
Globulin, Total: 3.3 g/dL (ref 1.5–4.5)
Glucose: 80 mg/dL (ref 70–99)
Potassium: 4.3 mmol/L (ref 3.5–5.2)
Sodium: 136 mmol/L (ref 134–144)
Total Protein: 7.5 g/dL (ref 6.0–8.5)
eGFR: 79 mL/min/{1.73_m2} (ref >59)

## 2022-09-21 LAB — HCV INTERPRETATION

## 2022-09-21 LAB — HIV ANTIBODY (ROUTINE TESTING W REFLEX): HIV Screen 4th Generation wRfx: NONREACTIVE

## 2022-09-21 MED ORDER — FERROUS GLUCONATE 240 (27 FE) MG PO TABS: 240.0000 mg | ORAL_TABLET | Freq: Every day | ORAL | 3 refills | Status: DC

## 2023-02-14 ENCOUNTER — Other Ambulatory Visit (HOSPITAL_COMMUNITY)
Admission: RE | Admit: 2023-02-14 | Discharge: 2023-02-14 | Disposition: A | Discharge status: Home / Self Care | Payer: Managed Care, Other (non HMO) | Source: Ambulatory Visit | Attending: Family Medicine | Admitting: Family Medicine

## 2023-02-14 ENCOUNTER — Ambulatory Visit: Payer: Managed Care, Other (non HMO) | Attending: Family Medicine | Admitting: Family Medicine

## 2023-02-14 ENCOUNTER — Encounter: Payer: Self-pay | Admitting: Family Medicine

## 2023-02-14 VITALS — BP 126/85 | HR 85 | Ht 66.0 in | Wt 181.6 lb

## 2023-02-14 DIAGNOSIS — Z131 Encounter for screening for diabetes mellitus: Secondary | ICD-10-CM

## 2023-02-14 DIAGNOSIS — Z1211 Encounter for screening for malignant neoplasm of colon: Secondary | ICD-10-CM

## 2023-02-14 DIAGNOSIS — B3731 Acute candidiasis of vulva and vagina: Secondary | ICD-10-CM | POA: Diagnosis not present

## 2023-02-14 DIAGNOSIS — I10 Essential (primary) hypertension: Secondary | ICD-10-CM | POA: Diagnosis not present

## 2023-02-14 DIAGNOSIS — Z0001 Encounter for general adult medical examination with abnormal findings: Secondary | ICD-10-CM

## 2023-02-14 DIAGNOSIS — Z124 Encounter for screening for malignant neoplasm of cervix: Secondary | ICD-10-CM

## 2023-02-14 DIAGNOSIS — Z1231 Encounter for screening mammogram for malignant neoplasm of breast: Secondary | ICD-10-CM | POA: Diagnosis not present

## 2023-02-14 MED ORDER — AMLODIPINE BESYLATE 5 MG PO TABS: 5.0000 mg | ORAL_TABLET | Freq: Every day | ORAL | 1 refills | Status: DC

## 2023-02-14 MED ORDER — FLUCONAZOLE 150 MG PO TABS
150.0000 mg | ORAL_TABLET | Freq: Once | ORAL | 1 refills | Status: AC
Start: 2023-02-14 — End: 2023-02-14

## 2023-02-14 NOTE — Patient Instructions (Signed)

## 2023-02-14 NOTE — Progress Notes (Signed)
Subjective:  Patient ID: Alyssa Dunn, female    DOB: Feb 01, 1971  Age: 52 y.o. MRN: [id number]  CC: Annual Exam and Gynecologic Exam   HPI Alyssa Dunn is a 52 y.o. year old female with a history of hypertension here for gynecologic exam.  Interval History:  She is due for screening for breast cancer, cervical and colorectal cancer.  She sees a dentist regularly but does not see an ophthalmologist regularly.  Eats a good amount of fruits but not much vegetables.  Yesterday she did use a shaving cream and noticed some burning on her labia. Endorses adherence with her antihypertensive.  Past Medical History:  Diagnosis Date   Anemia    Thalassemia trait     Past Surgical History:  Procedure Laterality Date   CESAREAN SECTION  2014   laparoscopy      Family History  Problem Relation Age of Onset   Renal Disease Paternal Grandmother    Stroke Paternal Grandfather    Diabetes Neg Hx    Heart disease Neg Hx    Cancer Neg Hx     Social History   Socioeconomic History   Marital status: Married    Spouse name: Not on file   Number of children: 1   Years of education: Not on file   Highest education level: Not on file  Occupational History   Not on file  Tobacco Use   Smoking status: Never   Smokeless tobacco: Never  Vaping Use   Vaping Use: Never used  Substance and Sexual Activity   Alcohol use: Yes    Alcohol/week: 2.0 standard drinks of alcohol    Types: 2 Glasses of wine per week    Comment: weekly   Drug use: Not Currently   Sexual activity: Not on file  Other Topics Concern   Not on file  Social History Narrative   Not on file   Social Determinants of Health   Financial Resource Strain: Not on file  Food Insecurity: Not on file  Transportation Needs: Not on file  Physical Activity: Not on file  Stress: Not on file  Social Connections: Not on file    No Known Allergies  Outpatient Medications Prior to Visit  Medication Sig Dispense  Refill   amLODipine (NORVASC) 5 MG tablet Take 1 tablet (5 mg total) by mouth daily. 90 tablet 1   ferrous gluconate (FERGON) 240 (27 FE) MG tablet Take 1 tablet (240 mg total) by mouth daily. (Patient not taking: Reported on 02/14/2023) 30 tablet 3   fluticasone (FLONASE) 50 MCG/ACT nasal spray Place 2 sprays into both nostrils daily. (Patient not taking: Reported on 09/20/2022) 16 g 6   No facility-administered medications prior to visit.     ROS Review of Systems  Constitutional:  Negative for activity change, appetite change and fatigue.  HENT:  Negative for congestion, sinus pressure and sore throat.   Eyes:  Negative for visual disturbance.  Respiratory:  Negative for cough, chest tightness, shortness of breath and wheezing.   Cardiovascular:  Negative for chest pain and palpitations.  Gastrointestinal:  Negative for abdominal distention, abdominal pain and constipation.  Endocrine: Negative for polydipsia.  Genitourinary:  Negative for dysuria and frequency.  Musculoskeletal:  Negative for arthralgias and back pain.  Skin:  Negative for rash.  Neurological:  Negative for tremors, light-headedness and numbness.  Hematological:  Does not bruise/bleed easily.  Psychiatric/Behavioral:  Negative for agitation and behavioral problems.     Objective:  BP  126/85   Pulse 85   Ht 5\' 6"  (1.676 m)   Wt 181 lb 9.6 oz (82.4 kg)   SpO2 100%   BMI 29.31 kg/m      02/14/2023   10:23 AM 09/20/2022    3:53 PM 08/27/2021    3:38 PM  BP/Weight  Systolic BP 126 158 137  Diastolic BP 85 97 90  Wt. (Lbs) 181.6 177.4 158.6  BMI 29.31 kg/m2 28.63 kg/m2 25.6 kg/m2      Physical Exam Exam conducted with a chaperone present.  Constitutional:      General: She is not in acute distress.    Appearance: She is well-developed. She is not diaphoretic.  HENT:     Head: Normocephalic.     Right Ear: External ear normal.     Left Ear: External ear normal.     Nose: Nose normal.  Eyes:      Conjunctiva/sclera: Conjunctivae normal.     Pupils: Pupils are equal, round, and reactive to light.  Neck:     Vascular: No JVD.  Cardiovascular:     Rate and Rhythm: Normal rate and regular rhythm.     Heart sounds: Normal heart sounds. No murmur heard.    No gallop.  Pulmonary:     Effort: Pulmonary effort is normal. No respiratory distress.     Breath sounds: Normal breath sounds. No wheezing or rales.  Chest:     Chest wall: No tenderness.  Breasts:    Right: Normal. No mass, nipple discharge or tenderness.     Left: Normal. No mass, nipple discharge or tenderness.  Abdominal:     General: Bowel sounds are normal. There is no distension.     Palpations: Abdomen is soft. There is no mass.     Tenderness: There is no abdominal tenderness.     Hernia: There is no hernia in the left inguinal area or right inguinal area.  Genitourinary:    General: Normal vulva.     Pubic Area: No rash.      Labia:        Right: No rash.        Left: Lesion (slight skin abrasion) present. No rash.      Vagina: Vaginal discharge (whitish cheesy) present.     Cervix: Normal.     Uterus: Normal.      Adnexa: Right adnexa normal and left adnexa normal.       Right: No tenderness.         Left: No tenderness.    Musculoskeletal:        General: No tenderness. Normal range of motion.     Cervical back: Normal range of motion. No tenderness.  Lymphadenopathy:     Upper Body:     Right upper body: No supraclavicular or axillary adenopathy.     Left upper body: No supraclavicular or axillary adenopathy.  Skin:    General: Skin is warm and dry.  Neurological:     Mental Status: She is alert and oriented to person, place, and time.     Deep Tendon Reflexes: Reflexes are normal and symmetric.        Latest Ref Rng & Units 09/20/2022    4:29 PM 10/02/2020   11:08 AM 10/11/2019   11:09 AM  CMP  Glucose 70 - 99 mg/dL 80  82  79   BUN 6 - 24 mg/dL 10  13  11    Creatinine 0.57 - 1.00 mg/dL 1.61  0.97  1.01   Sodium 134 - 144 mmol/L 136  137  137   Potassium 3.5 - 5.2 mmol/L 4.3  4.5  4.7   Chloride 96 - 106 mmol/L 103  102  103   CO2 20 - 29 mmol/L 21  20  19    Calcium 8.7 - 10.2 mg/dL 9.1  9.3  9.1   Total Protein 6.0 - 8.5 g/dL 7.5  7.5    Total Bilirubin 0.0 - 1.2 mg/dL <4.0  0.3    Alkaline Phos 44 - 121 IU/L 89  86    AST 0 - 40 IU/L 20  27    ALT 0 - 32 IU/L 11  13      Lipid Panel     Component Value Date/Time   CHOL 128 10/11/2019 1109   TRIG 40 10/11/2019 1109   HDL 60 10/11/2019 1109   CHOLHDL 2.1 10/11/2019 1109   LDLCALC 58 10/11/2019 1109    CBC    Component Value Date/Time   WBC 6.6 09/20/2022 1629   WBC 9.7 06/28/2009 1610   RBC 4.47 09/20/2022 1629   RBC 4.15 06/28/2009 1610   HGB 9.3 (L) 09/20/2022 1629   HCT 31.0 (L) 09/20/2022 1629   PLT CANCELED 09/20/2022 1629   MCV 69 (L) 09/20/2022 1629   MCH 20.8 (L) 09/20/2022 1629   MCHC 30.0 (L) 09/20/2022 1629   MCHC 32.5 06/28/2009 1610   RDW 18.4 (H) 09/20/2022 1629   LYMPHSABS 2.0 09/20/2022 1629   MONOABS 0.4 06/28/2009 1610   EOSABS 0.1 09/20/2022 1629   BASOSABS 0.0 09/20/2022 1629    Lab Results  Component Value Date   HGBA1C 5.4 10/11/2019    Assessment & Plan:  1. Annual visit for general adult medical examination with abnormal findings Counseled on 150 minutes of exercise per week, healthy eating (including decreased daily intake of saturated fats, cholesterol, added sugars, sodium), routine healthcare maintenance.  - CBC with Differential/Platelet; Future  2. Essential hypertension Controlled Counseled on blood pressure goal of less than 130/80, low-sodium, DASH diet, medication compliance, 150 minutes of moderate intensity exercise per week. Discussed medication compliance, adverse effects. - LP+Non-HDL Cholesterol; Future - CMP14+EGFR; Future - amLODipine (NORVASC) 5 MG tablet; Take 1 tablet (5 mg total) by mouth daily.  Dispense: 90 tablet; Refill: 1  3. Encounter for  screening mammogram for malignant neoplasm of breast - MM 3D SCREENING MAMMOGRAM BILATERAL BREAST; Future  4. Vaginal candidiasis - fluconazole (DIFLUCAN) 150 MG tablet; Take 1 tablet (150 mg total) by mouth once for 1 dose. Then repeat in 72 hours  Dispense: 2 tablet; Refill: 1  5. Screening for cervical cancer - Cytology - PAP  6. Screening for colon cancer - Ambulatory referral to Gastroenterology  7. Screening for diabetes mellitus - Hemoglobin A1c; Future    Meds ordered this encounter  Medications   amLODipine (NORVASC) 5 MG tablet    Sig: Take 1 tablet (5 mg total) by mouth daily.    Dispense:  90 tablet    Refill:  1   fluconazole (DIFLUCAN) 150 MG tablet    Sig: Take 1 tablet (150 mg total) by mouth once for 1 dose. Then repeat in 72 hours    Dispense:  2 tablet    Refill:  1    Follow-up: Return in about 6 months (around 08/16/2023) for Chronic medical conditions.       Hoy Register, MD, FAAFP. [facility] and [facility] [place], [place] [phone]  02/14/2023, 11:05 AM

## 2023-02-17 ENCOUNTER — Other Ambulatory Visit: Payer: Managed Care, Other (non HMO)

## 2023-02-17 LAB — CYTOLOGY - PAP
Adequacy: ABSENT
Comment: NEGATIVE
Diagnosis: NEGATIVE
High risk HPV: NEGATIVE

## 2023-02-18 ENCOUNTER — Ambulatory Visit: Payer: Managed Care, Other (non HMO)

## 2023-02-23 ENCOUNTER — Encounter: Payer: Self-pay | Admitting: Gastroenterology

## 2023-03-25 ENCOUNTER — Ambulatory Visit (AMBULATORY_SURGERY_CENTER): Payer: Managed Care, Other (non HMO)

## 2023-03-25 VITALS — Ht 66.0 in | Wt 178.0 lb

## 2023-03-25 DIAGNOSIS — Z1211 Encounter for screening for malignant neoplasm of colon: Secondary | ICD-10-CM

## 2023-03-25 MED ORDER — NA SULFATE-K SULFATE-MG SULF 17.5-3.13-1.6 GM/177ML PO SOLN
1.0000 | Freq: Once | ORAL | 0 refills | Status: AC
Start: 2023-03-25 — End: 2023-03-25

## 2023-03-25 NOTE — Progress Notes (Signed)

## 2023-03-28 ENCOUNTER — Encounter: Payer: Self-pay | Admitting: Gastroenterology

## 2023-04-08 ENCOUNTER — Encounter: Payer: Self-pay | Admitting: Gastroenterology

## 2023-04-08 ENCOUNTER — Ambulatory Visit: Payer: Managed Care, Other (non HMO) | Admitting: Gastroenterology

## 2023-04-08 VITALS — BP 133/86 | HR 82 | Temp 98.0°F | Resp 13 | Ht 66.0 in | Wt 178.0 lb

## 2023-04-08 DIAGNOSIS — K635 Polyp of colon: Secondary | ICD-10-CM

## 2023-04-08 DIAGNOSIS — D122 Benign neoplasm of ascending colon: Secondary | ICD-10-CM | POA: Diagnosis not present

## 2023-04-08 DIAGNOSIS — D123 Benign neoplasm of transverse colon: Secondary | ICD-10-CM

## 2023-04-08 DIAGNOSIS — Z1211 Encounter for screening for malignant neoplasm of colon: Secondary | ICD-10-CM | POA: Diagnosis not present

## 2023-04-08 MED ORDER — SODIUM CHLORIDE 0.9 % IV SOLN: 500.0000 mL | Freq: Once | INTRAVENOUS | Status: DC

## 2023-04-08 NOTE — Progress Notes (Signed)
Called to room to assist during endoscopic procedure.  Patient ID and intended procedure confirmed with present staff. Received instructions for my participation in the procedure from the performing physician.  

## 2023-04-08 NOTE — Patient Instructions (Addendum)
Continue present medications. Await pathology results.  Please read over handouts about polyps, diverticulosis and hemorrhoids   YOU HAD AN ENDOSCOPIC PROCEDURE TODAY AT THE [facility]:   Refer to the procedure report that was given to you for any specific questions about what was found during the examination.  If the procedure report does not answer your questions, please call your gastroenterologist to clarify.  If you requested that your care partner not be given the details of your procedure findings, then the procedure report has been included in a sealed envelope for you to review at your convenience later.  YOU SHOULD EXPECT: Some feelings of bloating in the abdomen. Passage of more gas than usual.  Walking can help get rid of the air that was put into your GI tract during the procedure and reduce the bloating. If you had a lower endoscopy (such as a colonoscopy or flexible sigmoidoscopy) you may notice spotting of blood in your stool or on the toilet paper. If you underwent a bowel prep for your procedure, you may not have a normal bowel movement for a few days.  Please Note:  You might notice some irritation and congestion in your nose or some drainage.  This is from the oxygen used during your procedure.  There is no need for concern and it should clear up in a day or so.  SYMPTOMS TO REPORT IMMEDIATELY:  Following lower endoscopy (colonoscopy or flexible sigmoidoscopy):  Excessive amounts of blood in the stool  Significant tenderness or worsening of abdominal pains  Swelling of the abdomen that is new, acute  Fever of 100F or higher  For urgent or emergent issues, a gastroenterologist can be reached at any hour by calling [phone]. Do not use MyChart messaging for urgent concerns.    DIET:  We do recommend a small meal at first, but then you may proceed to your regular diet.  Drink plenty of fluids but you should avoid alcoholic beverages for 24  hours.  ACTIVITY:  You should plan to take it easy for the rest of today and you should NOT DRIVE or use heavy machinery until tomorrow (because of the sedation medicines used during the test).    FOLLOW UP: Our staff will call the number listed on your records the next business day following your procedure.  We will call around 7:15- 8:00 am to check on you and address any questions or concerns that you may have regarding the information given to you following your procedure. If we do not reach you, we will leave a message.     If any biopsies were taken you will be contacted by phone or by letter within the next 1-3 weeks.  Please call us at [phone] if you have not heard about the biopsies in 3 weeks.    SIGNATURES/CONFIDENTIALITY: You and/or your care partner have signed paperwork which will be entered into your electronic medical record.  These signatures attest to the fact that that the information above on your After Visit Summary has been reviewed and is understood.  Full responsibility of the confidentiality of this discharge information lies with you and/or your care-partner.  

## 2023-04-08 NOTE — Progress Notes (Signed)
[facility] History and Physical   Primary Care Physician:  Hoy Register, MD   Reason for Procedure:   Colon cancer screening  Plan:    colonoscopy     HPI: Alyssa Dunn is a 52 y.o. female  here for colonoscopy screening - first time exam.   Patient denies any bowel symptoms at this time. No family history of colon cancer known. Otherwise feels well without any cardiopulmonary symptoms.   I have discussed risks / benefits of anesthesia and endoscopic procedure with Raynelle Dick and they wish to proceed with the exams as outlined today.    Past Medical History:  Diagnosis Date   Anemia    Hypertension    Thalassemia trait     Past Surgical History:  Procedure Laterality Date   CESAREAN SECTION  2014   laparoscopy      Prior to Admission medications   Medication Sig Start Date End Date Taking? Authorizing Provider  amLODipine (NORVASC) 5 MG tablet Take 1 tablet (5 mg total) by mouth daily. 02/14/23  Yes Hoy Register, MD    Current Outpatient Medications  Medication Sig Dispense Refill   amLODipine (NORVASC) 5 MG tablet Take 1 tablet (5 mg total) by mouth daily. 90 tablet 1   Current Facility-Administered Medications  Medication Dose Route Frequency Provider Last Rate Last Admin   0.9 %  sodium chloride infusion  500 mL Intravenous Once Layson Bertsch, Willaim Rayas, MD        Allergies as of 04/08/2023   (No Known Allergies)    Family History  Problem Relation Age of Onset   Renal Disease Paternal Grandmother    Stroke Paternal Grandfather    Diabetes Neg Hx    Heart disease Neg Hx    Cancer Neg Hx    Colon cancer Neg Hx    Colon polyps Neg Hx    Esophageal cancer Neg Hx    Rectal cancer Neg Hx    Stomach cancer Neg Hx     Social History   Socioeconomic History   Marital status: Married    Spouse name: Not on file   Number of children: 1   Years of education: Not on file   Highest education level: Not on file  Occupational History    Not on file  Tobacco Use   Smoking status: Never   Smokeless tobacco: Never  Vaping Use   Vaping status: Never Used  Substance and Sexual Activity   Alcohol use: Never   Drug use: Not Currently   Sexual activity: Not on file  Other Topics Concern   Not on file  Social History Narrative   Not on file   Social Determinants of Health   Financial Resource Strain: Not on file  Food Insecurity: Not on file  Transportation Needs: Not on file  Physical Activity: Not on file  Stress: Not on file  Social Connections: Not on file  Intimate Partner Violence: Not on file    Review of Systems: All other review of systems negative except as mentioned in the HPI.  Physical Exam: Vital signs BP (!) 154/90 (BP Location: Right Arm, Patient Position: Sitting) Comment (BP Location): left arm, 150/97 right arm  Pulse 88   Temp 98 F (36.7 C)   Ht 5\' 6"  (1.676 m)   Wt 178 lb (80.7 kg)   LMP 03/01/2023   SpO2 100%   BMI 28.73 kg/m   General:   Alert,  Well-developed, pleasant and cooperative in NAD Lungs:  Clear throughout to auscultation.   Heart:  Regular rate and rhythm Abdomen:  Soft, nontender and nondistended.   Neuro/Psych:  Alert and cooperative. Normal mood and affect. A and O x 3  Harlin Rain, MD [facility]

## 2023-04-08 NOTE — Progress Notes (Signed)
Sedate, gd SR, tolerated procedure well, VSS, report to RN 

## 2023-04-08 NOTE — Progress Notes (Signed)
Pt denies need to pass air while in the RR- abdomen soft and easily palpable.  Pt states she is comfortable, no pain.  Encouraged to ambulate, drink warm fluids at home if she becomes uncomfortable- understanding voiced

## 2023-04-08 NOTE — Progress Notes (Signed)
Pt's states no medical or surgical changes since previsit or office visit. 

## 2023-04-08 NOTE — Op Note (Signed)
[facility] Patient Name: Alyssa Dunn Procedure Date: 04/08/2023 10:23 AM MRN: [id number] Endoscopist: Viviann Spare P. Adela Lank , MD, [id number] Age: 52 Referring MD:  Date of Birth: Dec 04, 1970 Gender: Female Account #: [id number] Procedure:                Colonoscopy Indications:              Screening for colorectal malignant neoplasm, This                            is the patient's first colonoscopy Medicines:                Monitored Anesthesia Care Procedure:                Pre-Anesthesia Assessment:                           - Prior to the procedure, a History and Physical                            was performed, and patient medications and                            allergies were reviewed. The patient's tolerance of                            previous anesthesia was also reviewed. The risks                            and benefits of the procedure and the sedation                            options and risks were discussed with the patient.                            All questions were answered, and informed consent                            was obtained. Prior Anticoagulants: The patient has                            taken no anticoagulant or antiplatelet agents. ASA                            Grade Assessment: II - A patient with mild systemic                            disease. After reviewing the risks and benefits,                            the patient was deemed in satisfactory condition to                            undergo the procedure.  After obtaining informed consent, the colonoscope                            was passed under direct vision. Throughout the                            procedure, the patient's blood pressure, pulse, and                            oxygen saturations were monitored continuously. The                            Olympus PCF-H190DL (#[id number]) Colonoscope was                            introduced through  the anus and advanced to the the                            cecum, identified by appendiceal orifice and                            ileocecal valve. The colonoscopy was performed                            without difficulty. The patient tolerated the                            procedure well. The quality of the bowel                            preparation was good. The ileocecal valve,                            appendiceal orifice, and rectum were photographed. Scope In: 10:30:23 AM Scope Out: 10:49:02 AM Scope Withdrawal Time: 0 hours 15 minutes 9 seconds  Total Procedure Duration: 0 hours 18 minutes 39 seconds  Findings:                 The perianal and digital rectal examinations were                            normal.                           A few medium-mouthed diverticula were found in the                            left colon and right colon.                           Two sessile polyps were found in the ascending                            colon. The polyps were 2 to 3 mm in size. These  polyps were removed with a cold snare. Resection                            and retrieval were complete.                           Two flat and sessile polyps were found in the                            transverse colon. The polyps were 4 mm in size.                            These polyps were removed with a cold snare.                            Resection and retrieval were complete.                           Internal hemorrhoids were found during retroflexion.                           The exam was otherwise without abnormality. Complications:            No immediate complications. Estimated blood loss:                            Minimal. Estimated Blood Loss:     Estimated blood loss was minimal. Impression:               - Diverticulosis in the left colon and in the right                            colon.                           - Two 2 to 3 mm polyps in the  ascending colon,                            removed with a cold snare. Resected and retrieved.                           - Two 4 mm polyps in the transverse colon, removed                            with a cold snare. Resected and retrieved.                           - Internal hemorrhoids.                           - The examination was otherwise normal. Recommendation:           - Patient has a contact number available for  emergencies. The signs and symptoms of potential                            delayed complications were discussed with the                            patient. Return to normal activities tomorrow.                            Written discharge instructions were provided to the                            patient.                           - Resume previous diet.                           - Continue present medications.                           - Await pathology results. Viviann Spare P. Leahann Lempke, MD 04/08/2023 10:53:41 AM This report has been signed electronically.

## 2023-04-11 ENCOUNTER — Telehealth: Payer: Self-pay

## 2023-04-11 NOTE — Telephone Encounter (Signed)
Follow up call placed, no answer and no VM. 

## 2023-08-16 ENCOUNTER — Ambulatory Visit: Payer: Self-pay | Admitting: Family Medicine

## 2023-09-14 ENCOUNTER — Other Ambulatory Visit: Payer: Self-pay | Admitting: Family Medicine

## 2023-09-14 DIAGNOSIS — I10 Essential (primary) hypertension: Secondary | ICD-10-CM

## 2023-10-10 ENCOUNTER — Other Ambulatory Visit: Payer: Self-pay | Admitting: Family Medicine

## 2023-10-10 DIAGNOSIS — I10 Essential (primary) hypertension: Secondary | ICD-10-CM

## 2023-10-14 ENCOUNTER — Other Ambulatory Visit: Payer: Self-pay | Admitting: Family Medicine

## 2023-10-14 DIAGNOSIS — I10 Essential (primary) hypertension: Secondary | ICD-10-CM

## 2024-01-10 ENCOUNTER — Other Ambulatory Visit: Payer: Self-pay | Admitting: Family Medicine

## 2024-01-10 DIAGNOSIS — I10 Essential (primary) hypertension: Secondary | ICD-10-CM

## 2024-01-10 NOTE — Telephone Encounter (Signed)
 Copied from CRM [id number]. Topic: Clinical - Medication Refill >> Jan 10, 2024  8:55 AM Crispin Dolphin wrote: Most Recent Primary Care Visit:  Provider: NEWLIN, ENOBONG  Department: [facility]  Visit Type: PHYSICAL  Date: 02/14/2023  Medication: amLODipine  (NORVASC ) 5 MG tablet - patient scheduled appt but 1st available not until 6/25 added to waitlist for 5/20. Patient only has 10 left and is leaving for cruise 5/24. Thank You  Has the patient contacted their pharmacy? Yes (Agent: If no, request that the patient contact the pharmacy for the refill. If patient does not wish to contact the pharmacy document the reason why and proceed with request.) (Agent: If yes, when and what did the pharmacy advise?) no refills  Is this the correct pharmacy for this prescription? Yes If no, delete pharmacy and type the correct one.  This is the patient's preferred pharmacy:  [id number] [place], [place] - [address] [place] [address] Phone: [phone] Fax: [phone]   Has the prescription been filled recently? No  Is the patient out of the medication? No  Has the patient been seen for an appointment in the last year OR does the patient have an upcoming appointment? Yes  Can we respond through MyChart? No  Agent: Please be advised that Rx refills may take up to 3 business days. We ask that you follow-up with your pharmacy.

## 2024-01-11 MED ORDER — AMLODIPINE BESYLATE 5 MG PO TABS
5.0000 mg | ORAL_TABLET | Freq: Every day | ORAL | 1 refills | Status: DC
Start: 2024-01-11 — End: 2024-03-05

## 2024-01-11 NOTE — Telephone Encounter (Signed)
 Requested medication (s) are due for refill today: yes  Requested medication (s) are on the active medication list: yes  Last refill:  09/14/23  Future visit scheduled: yes  Notes to clinic:  30 day courtesy refill already given   Requested Prescriptions  Pending Prescriptions Disp Refills   amLODipine  (NORVASC ) 5 MG tablet 30 tablet 0    Sig: Take 1 tablet (5 mg total) by mouth daily.     Cardiovascular: Calcium Channel Blockers 2 Failed - 01/11/2024  3:45 PM      Failed - Valid encounter within last 6 months    Recent Outpatient Visits           11 months ago Essential hypertension   [facility] [place]. [facility] Joaquin Mulberry, MD   1 year ago Microcytic anemia   [facility] [place]. [facility] Joaquin Mulberry, MD   2 years ago Microcytic anemia   [facility] [place]. [facility] [place], [place]   3 years ago Essential hypertension   [facility] [place]. [facility] Joaquin Mulberry, MD   3 years ago Essential hypertension   [facility] Of . [facility] [place], Lavelle Posey, MD              Passed - Last BP in normal range    BP Readings from Last 1 Encounters:  04/08/23 133/86         Passed - Last Heart Rate in normal range    Pulse Readings from Last 1 Encounters:  04/08/23 82

## 2024-02-29 ENCOUNTER — Ambulatory Visit: Admitting: Family Medicine

## 2024-03-05 ENCOUNTER — Encounter: Payer: Self-pay | Admitting: Family Medicine

## 2024-03-05 ENCOUNTER — Ambulatory Visit: Attending: Family Medicine | Admitting: Family Medicine

## 2024-03-05 VITALS — BP 125/85 | HR 89 | Ht 66.0 in | Wt 185.6 lb

## 2024-03-05 DIAGNOSIS — Z1231 Encounter for screening mammogram for malignant neoplasm of breast: Secondary | ICD-10-CM

## 2024-03-05 DIAGNOSIS — Z13228 Encounter for screening for other metabolic disorders: Secondary | ICD-10-CM

## 2024-03-05 DIAGNOSIS — M5412 Radiculopathy, cervical region: Secondary | ICD-10-CM | POA: Diagnosis not present

## 2024-03-05 DIAGNOSIS — Z0001 Encounter for general adult medical examination with abnormal findings: Secondary | ICD-10-CM | POA: Diagnosis not present

## 2024-03-05 DIAGNOSIS — I10 Essential (primary) hypertension: Secondary | ICD-10-CM | POA: Diagnosis not present

## 2024-03-05 MED ORDER — AMLODIPINE BESYLATE 5 MG PO TABS: 5.0000 mg | ORAL_TABLET | Freq: Every day | ORAL | 1 refills | Status: DC

## 2024-03-05 NOTE — Patient Instructions (Signed)
 VISIT SUMMARY:  Today, you came in for a routine follow-up visit to check on your hypertension and discuss some other health concerns. We reviewed your blood pressure readings, discussed your premenopausal symptoms, and addressed your occasional arm stiffness. We also talked about the importance of regular health screenings.  YOUR PLAN:  -ESSENTIAL HYPERTENSION: Your blood pressure readings at home have been a bit high at times, but they improve with dietary changes. You are currently taking Amlodipine  5 mg daily, which you tolerate well. Continue taking this medication and monitor your blood pressure regularly at home. We also recommend reducing your sodium intake to help manage your blood pressure.  -CERVICAL RADICULOPATHY: The occasional stiffness and numbness in your arm are likely due to a pinched nerve. This condition can cause discomfort but usually improves with stretching exercises. We recommend continuing these exercises and making ergonomic adjustments to your posture.  -PREMENOPAUSAL SYMPTOMS: The aches in your fingers and toes and breast tenderness before your period are likely due to hormonal changes. We discussed using turmeric to help manage inflammation and pain as an alternative to NSAIDs.  -GENERAL HEALTH MAINTENANCE: You are due for several health screenings, including a cholesterol test and a mammogram. Regular screenings are important for maintaining your overall health. We will order blood tests to check your kidney and liver function, blood count, diabetes, and thyroid function. Please schedule your cholesterol test within the next week and a mammogram as soon as possible. Continue with your exercise routine and find ways to keep it up during the school year.  INSTRUCTIONS:  Please recheck your blood pressure during this visit. Schedule your cholesterol test within the next week and a mammogram as soon as possible. Continue monitoring your blood pressure at home and follow the  dietary recommendations we discussed. Keep up with your exercise routine and try to incorporate it into your schedule during the school year.

## 2024-03-05 NOTE — Progress Notes (Signed)
 Subjective:  Patient ID: Alyssa Dunn, female    DOB: 1971-08-21  Age: 53 y.o. MRN: [id number]  CC: Annual Exam     Discussed the use of AI scribe software for clinical note transcription with the patient, who gave verbal consent to proceed.  History of Present Illness Alyssa Dunn is a 53 year old female with hypertension who presents for an annual  visit.  Her home blood pressure readings vary, with occasional systolic elevations up to 150 mmHg. Blood pressure improves with dietary changes, including increased vegetable intake and reduced fast food consumption. She takes amlodipine  5 mg daily without experiencing ankle swelling.  She experiences finger and toe aches and breast tenderness premenstrually, resolving with menstruation onset which she thinks align with premenopausal changes.  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 Exercises alleviate the stiffness. No neck pain is present.  She is due for a cholesterol check, last done in 2021, and a mammogram, not performed in ten years. She feels anxious about the mammogram due to previous dense breast tissue findings. She has increased her exercise during the summer, including walking and using an elliptical machine, and is considering maintaining this routine during the school year. Last Pap smear was normal in 2024 and is not due until 02/2028. She is up-to-date on her colonoscopy which she is not due until 2029 due to retrieval of 2 sessile polyps in 04/2023.    Past Medical History:  Diagnosis Date   Anemia    Hypertension    Thalassemia trait     Past Surgical History:  Procedure Laterality Date   CESAREAN SECTION  2014   laparoscopy      Family History  Problem Relation Age of Onset   Renal Disease Paternal Grandmother    Stroke Paternal Grandfather    Diabetes Neg Hx    Heart disease Neg Hx    Cancer Neg Hx     Colon cancer Neg Hx    Colon polyps Neg Hx    Esophageal cancer Neg Hx    Rectal cancer Neg Hx    Stomach cancer Neg Hx     Social History   Socioeconomic History   Marital status: Married    Spouse name: Not on file   Number of children: 1   Years of education: Not on file   Highest education level: Not on file  Occupational History   Not on file  Tobacco Use   Smoking status: Never   Smokeless tobacco: Never  Vaping Use   Vaping status: Never Used  Substance and Sexual Activity   Alcohol use: Never   Drug use: Not Currently   Sexual activity: Not on file  Other Topics Concern   Not on file  Social History Narrative   Not on file   Social Drivers of Health   Financial Resource Strain: Not on file  Food Insecurity: Not on file  Transportation Needs: Not on file  Physical Activity: Not on file  Stress: Not on file  Social Connections: Not on file    No Known Allergies  Outpatient Medications Prior to Visit  Medication Sig Dispense Refill   amLODipine  (NORVASC ) 5 MG tablet Take 1 tablet (5 mg total) by mouth daily. 30 tablet 1   No facility-administered medications prior to visit.     ROS Review of Systems  Constitutional:  Negative for activity change and appetite change.  HENT:  Negative for sinus pressure and sore throat.   Respiratory:  Negative for chest tightness, shortness of breath and wheezing.   Cardiovascular:  Negative for chest pain and palpitations.  Gastrointestinal:  Negative for abdominal distention, abdominal pain and constipation.  Genitourinary: Negative.   Musculoskeletal:        See HPI  Psychiatric/Behavioral:  Negative for behavioral problems and dysphoric mood.     Objective:  BP 125/85   Pulse 89   Ht 5' 6 (1.676 m)   Wt 185 lb 9.6 oz (84.2 kg)   SpO2 100%   BMI 29.96 kg/m      03/05/2024    2:35 PM 03/05/2024    1:55 PM 04/08/2023   11:10 AM  BP/Weight  Systolic BP 125 141 133  Diastolic BP 85 87 86  Wt. (Lbs)   185.6   BMI  29.96 kg/m2       Physical Exam Constitutional:      General: She is not in acute distress.    Appearance: She is well-developed. She is not diaphoretic.  HENT:     Head: Normocephalic.     Right Ear: External ear normal.     Left Ear: External ear normal.     Nose: Nose normal.     Mouth/Throat:     Mouth: Mucous membranes are moist.   Eyes:     Extraocular Movements: Extraocular movements intact.     Conjunctiva/sclera: Conjunctivae normal.     Pupils: Pupils are equal, round, and reactive to light.   Neck:     Vascular: No JVD.   Cardiovascular:     Rate and Rhythm: Normal rate and regular rhythm.     Pulses: Normal pulses.     Heart sounds: Normal heart sounds. No murmur heard.    No gallop.  Pulmonary:     Effort: Pulmonary effort is normal. No respiratory distress.     Breath sounds: Normal breath sounds. No wheezing or rales.  Chest:     Chest wall: No tenderness.  Abdominal:     General: Bowel sounds are normal. There is no distension.     Palpations: Abdomen is soft. There is no mass.     Tenderness: There is no abdominal tenderness.   Musculoskeletal:        General: No tenderness. Normal range of motion.     Cervical back: Normal range of motion.   Skin:    General: Skin is warm and dry.   Neurological:     Mental Status: She is alert and oriented to person, place, and time.     Deep Tendon Reflexes: Reflexes are normal and symmetric.   Psychiatric:        Mood and Affect: Mood normal.        Latest Ref Rng & Units 09/20/2022    4:29 PM 10/02/2020   11:08 AM 10/11/2019   11:09 AM  CMP  Glucose 70 - 99 mg/dL 80  82  79   BUN 6 - 24 mg/dL 10  13  11    Creatinine 0.57 - 1.00 mg/dL 9.10  9.02  8.98   Sodium 134 - 144 mmol/L 136  137  137   Potassium 3.5 - 5.2 mmol/L 4.3  4.5  4.7   Chloride 96 - 106 mmol/L 103  102  103   CO2 20 - 29 mmol/L 21  20  19    Calcium 8.7 - 10.2 mg/dL 9.1  9.3  9.1   Total Protein 6.0 -  8.5 g/dL 7.5  7.5     Total Bilirubin 0.0 - 1.2 mg/dL <9.7  0.3    Alkaline Phos 44 - 121 IU/L 89  86    AST 0 - 40 IU/L 20  27    ALT 0 - 32 IU/L 11  13      Lipid Panel     Component Value Date/Time   CHOL 128 10/11/2019 1109   TRIG 40 10/11/2019 1109   HDL 60 10/11/2019 1109   CHOLHDL 2.1 10/11/2019 1109   LDLCALC 58 10/11/2019 1109    CBC    Component Value Date/Time   WBC 6.6 09/20/2022 1629   WBC 9.7 06/28/2009 1610   RBC 4.47 09/20/2022 1629   RBC 4.15 06/28/2009 1610   HGB 9.3 (L) 09/20/2022 1629   HCT 31.0 (L) 09/20/2022 1629   PLT CANCELED 09/20/2022 1629   MCV 69 (L) 09/20/2022 1629   MCH 20.8 (L) 09/20/2022 1629   MCHC 30.0 (L) 09/20/2022 1629   MCHC 32.5 06/28/2009 1610   RDW 18.4 (H) 09/20/2022 1629   LYMPHSABS 2.0 09/20/2022 1629   MONOABS 0.4 06/28/2009 1610   EOSABS 0.1 09/20/2022 1629   BASOSABS 0.0 09/20/2022 1629    Lab Results  Component Value Date   HGBA1C 5.4 10/11/2019      1. Annual visit for general adult medical examination with abnormal findings (Primary)Counseled on 150 minutes of exercise per week, healthy eating (including decreased daily intake of saturated fats, cholesterol, added sugars, sodium), routine healthcare maintenance.   2. Essential hypertension Initially elevated but normalized upon repeat - amLODipine  (NORVASC ) 5 MG tablet; Take 1 tablet (5 mg total) by mouth daily.  Dispense: 90 tablet; Refill: 1  3. Cervical radiculopathy Pain is absent at the moment and she has no neck pain Reassured Encouraged to perform stretching exercises when this occurs  4. Screening for metabolic disorder - Hemoglobin A1c; Future - T4, free; Future - TSH; Future - T3; Future - LP+Non-HDL Cholesterol; Future - CMP14+EGFR; Future  5. Encounter for screening mammogram for malignant neoplasm of breast - MM 3D SCREENING MAMMOGRAM BILATERAL BREAST; Future  Meds ordered this encounter  Medications   amLODipine  (NORVASC ) 5 MG tablet    Sig: Take 1  tablet (5 mg total) by mouth daily.    Dispense:  90 tablet    Refill:  1    Follow-up: Return in about 1 year (around 03/05/2025) for CPE/ Preventive Health Exam.       Corrina Sabin, MD, FAAFP. [facility] [place], [place] [phone]   03/05/2024, 3:39 PM

## 2024-03-12 ENCOUNTER — Ambulatory Visit: Attending: Family Medicine

## 2024-03-12 DIAGNOSIS — Z13228 Encounter for screening for other metabolic disorders: Secondary | ICD-10-CM

## 2024-03-13 ENCOUNTER — Ambulatory Visit: Payer: Self-pay | Admitting: Family Medicine

## 2024-03-13 LAB — CMP14+EGFR
ALT: 7 IU/L (ref 0–32)
AST: 19 IU/L (ref 0–40)
Albumin: 4.1 g/dL (ref 3.9–4.9)
Alkaline Phosphatase: 112 IU/L (ref 44–121)
BUN/Creatinine Ratio: 11 (ref 9–23)
BUN: 10 mg/dL (ref 6–24)
Bilirubin Total: 0.3 mg/dL (ref 0.0–1.2)
CO2: 22 mmol/L (ref 20–29)
Calcium: 8.9 mg/dL (ref 8.7–10.2)
Chloride: 102 mmol/L (ref 96–106)
Creatinine, Ser: 0.95 mg/dL (ref 0.57–1.00)
Globulin, Total: 3 g/dL (ref 1.5–4.5)
Glucose: 90 mg/dL (ref 70–99)
Potassium: 4.1 mmol/L (ref 3.5–5.2)
Sodium: 137 mmol/L (ref 134–144)
Total Protein: 7.1 g/dL (ref 6.0–8.5)
eGFR: 73 mL/min/1.73 (ref >59)

## 2024-03-13 LAB — LP+NON-HDL CHOLESTEROL
Cholesterol, Total: 144 mg/dL (ref 100–199)
HDL: 52 mg/dL (ref >39)
LDL Chol Calc (NIH): 80 mg/dL (ref 0–99)
Total Non-HDL-Chol (LDL+VLDL): 92 mg/dL (ref 0–129)
Triglycerides: 59 mg/dL (ref 0–149)
VLDL Cholesterol Cal: 12 mg/dL (ref 5–40)

## 2024-03-13 LAB — TSH: TSH: 1.5 u[IU]/mL (ref 0.450–4.500)

## 2024-03-13 LAB — HEMOGLOBIN A1C
Est. average glucose Bld gHb Est-mCnc: 108 mg/dL
Hgb A1c MFr Bld: 5.4 % (ref 4.8–5.6)

## 2024-03-13 LAB — T4, FREE: Free T4: 1.17 ng/dL (ref 0.82–1.77)

## 2024-03-13 LAB — T3: T3, Total: 110 ng/dL (ref 71–180)

## 2024-04-12 ENCOUNTER — Ambulatory Visit

## 2024-09-01 ENCOUNTER — Other Ambulatory Visit: Payer: Self-pay | Admitting: Family Medicine

## 2024-09-01 DIAGNOSIS — I10 Essential (primary) hypertension: Secondary | ICD-10-CM

## 2025-03-06 ENCOUNTER — Encounter: Admitting: Family Medicine
